# Patient Record
Sex: Female | Born: 1980
Health system: Southern US, Community
[De-identification: ages and names within clinical notes are randomized; demographics above are authoritative.]

## PROBLEM LIST (undated history)

## (undated) DIAGNOSIS — F259 Schizoaffective disorder, unspecified: Secondary | ICD-10-CM

## (undated) DIAGNOSIS — F250 Schizoaffective disorder, bipolar type: Secondary | ICD-10-CM

## (undated) DIAGNOSIS — J45909 Unspecified asthma, uncomplicated: Secondary | ICD-10-CM

---

## 2016-04-24 ENCOUNTER — Emergency Department (HOSPITAL_BASED_OUTPATIENT_CLINIC_OR_DEPARTMENT_OTHER): Payer: Medicaid Other

## 2016-04-24 ENCOUNTER — Encounter (HOSPITAL_BASED_OUTPATIENT_CLINIC_OR_DEPARTMENT_OTHER): Payer: Self-pay | Admitting: Emergency Medicine

## 2016-04-24 ENCOUNTER — Emergency Department (HOSPITAL_BASED_OUTPATIENT_CLINIC_OR_DEPARTMENT_OTHER)
Admission: EM | Admit: 2016-04-24 | Discharge: 2016-04-24 | Disposition: A | Discharge status: Home / Self Care | Payer: Medicaid Other | Attending: Emergency Medicine | Admitting: Emergency Medicine

## 2016-04-24 DIAGNOSIS — J45909 Unspecified asthma, uncomplicated: Secondary | ICD-10-CM | POA: Insufficient documentation

## 2016-04-24 DIAGNOSIS — J069 Acute upper respiratory infection, unspecified: Secondary | ICD-10-CM | POA: Insufficient documentation

## 2016-04-24 DIAGNOSIS — R059 Cough, unspecified: Secondary | ICD-10-CM

## 2016-04-24 DIAGNOSIS — F410 Panic disorder [episodic paroxysmal anxiety] without agoraphobia: Secondary | ICD-10-CM | POA: Diagnosis not present

## 2016-04-24 DIAGNOSIS — Z79899 Other long term (current) drug therapy: Secondary | ICD-10-CM | POA: Insufficient documentation

## 2016-04-24 DIAGNOSIS — R05 Cough: Secondary | ICD-10-CM | POA: Diagnosis present

## 2016-04-24 HISTORY — DX: Schizoaffective disorder, bipolar type: F25.0

## 2016-04-24 HISTORY — DX: Unspecified asthma, uncomplicated: J45.909

## 2016-04-24 HISTORY — DX: Schizoaffective disorder, unspecified: F25.9

## 2016-04-24 MED ORDER — IPRATROPIUM BROMIDE 0.06 % NA SOLN
2.0000 | Freq: Once | NASAL | Status: DC
Start: 2016-04-24 — End: 2016-04-24
  Filled 2016-04-24: qty 15

## 2016-04-24 MED ORDER — IPRATROPIUM BROMIDE 0.06 % NA SOLN: 2.0000 | Freq: Four times a day (QID) | NASAL | 0 refills | Status: DC | PRN

## 2016-04-24 MED ORDER — PREDNISONE 10 MG PO TABS: 60.0000 mg | ORAL_TABLET | Freq: Once | ORAL | Status: DC

## 2016-04-24 MED ORDER — BENZONATATE 100 MG PO CAPS: 100.0000 mg | ORAL_CAPSULE | Freq: Three times a day (TID) | ORAL | 0 refills | Status: AC | PRN

## 2016-04-24 MED ORDER — DEXAMETHASONE 10 MG/ML FOR PEDIATRIC ORAL USE
16.0000 mg | Freq: Once | INTRAMUSCULAR | Status: DC
  Filled 2016-04-24: qty 1.6

## 2016-04-24 MED ORDER — DEXAMETHASONE 6 MG PO TABS
16.0000 mg | ORAL_TABLET | Freq: Once | ORAL | Status: AC
  Administered 2016-04-24: 16 mg via ORAL
  Filled 2016-04-24: qty 1

## 2016-04-24 MED ORDER — BENZONATATE 100 MG PO CAPS: 100.0000 mg | ORAL_CAPSULE | Freq: Three times a day (TID) | ORAL | 0 refills | Status: DC | PRN

## 2016-04-24 MED ORDER — BENZONATATE 100 MG PO CAPS
100.0000 mg | ORAL_CAPSULE | Freq: Once | ORAL | Status: AC
  Administered 2016-04-24: 100 mg via ORAL
  Filled 2016-04-24: qty 1

## 2016-04-24 MED ORDER — LORATADINE 10 MG PO TABS
10.0000 mg | ORAL_TABLET | Freq: Once | ORAL | Status: AC
  Administered 2016-04-24: 10 mg via ORAL
  Filled 2016-04-24: qty 1

## 2016-04-24 MED ORDER — PREDNISONE 50 MG PO TABS
60.0000 mg | ORAL_TABLET | Freq: Once | ORAL | Status: DC
  Filled 2016-04-24: qty 1

## 2016-04-24 MED FILL — BENZONATATE 100 MG CAPSULE: 100 | 6 days supply | Qty: 20 | Fill #0

## 2016-04-24 NOTE — ED Notes (Signed)
PA at bedside.

## 2016-04-24 NOTE — Discharge Instructions (Signed)
May take tessalon as needed up to three times daily for cough. May use OTC antihistamine for sinus congestion. Continue to use your albuterol and advair. Follow up with primary care if symptoms worsen or fail to improve. Return to the ED if you start having fevers, chest pain, sob or for any other reason.

## 2016-04-24 NOTE — ED Provider Notes (Signed)
[facility] Provider Note   CSN: [id number] Arrival date & time: 04/24/16  1149     History   Chief Complaint Chief Complaint  Patient presents with  . URI    HPI Savannah Fleming is a 35 y.o. female.  35 year old Caucasian female past history significant for asthma and schizoaffective schizophrenia presents by EMS to the ED for cough and congestion. Patient states that her symptoms started approximately 3 days ago. They're gradually worsened. She has tried over-the-counter remedies with little relief. Nothing makes better or worse. She endorses sinus congestion, rhinorrhea, otalgia, sore throat, cough. Patient with history of asthma. She was seen at urgent care this past week for worsening shortness of breath. Patient states that her asthma flares with the change in seasons. She has continued to use her albuterol inhaler. She was given an Advair inhaler as well per patient's request at urgent care. Patient states that her breathing improved until she developed URI symptoms. Patient states she was at work today and had a coughing spell and started hyperventilating and experienced numbness and tingling in lips and hands. Spontaneous resolved. 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 States her boss wanted her to come here to get checked out and get work excuse. Patient has no complaints at this time. Denies any OCPs, recent surgeries/hospitalizations, prolonged immobilization, tobacco use, unilateral leg swelling, history of DVT/PE. Patient denies any chest pain.     Past Medical History:  Diagnosis Date  . Asthma   . Schizo affective schizophrenia (HCC)     There are no active problems to display for this patient.   Past Surgical History:  Procedure Laterality Date  . CESAREAN SECTION      OB History    No data available       Home Medications    Prior to Admission medications   Medication Sig Start Date End Date Taking?  Authorizing Provider  ferrous sulfate 325 (65 FE) MG EC tablet Take 325 mg by mouth 3 (three) times daily with meals.   Yes Historical Provider, MD  folic acid (FOLVITE) 1 MG tablet Take 1 mg by mouth daily.   Yes Historical Provider, MD  venlafaxine (EFFEXOR) 75 MG tablet Take 75 mg by mouth 2 (two) times daily.   Yes Historical Provider, MD  ziprasidone (GEODON) 20 MG capsule Take 120 mg by mouth 2 (two) times daily with a meal.   Yes Historical Provider, MD  benzonatate (TESSALON PERLES) 100 MG capsule Take 1 capsule (100 mg total) by mouth 3 (three) times daily as needed for cough. 04/24/16   Rise MuKenneth T Leaphart, PA-C    Family History No family history on file.  Social History Social History  Substance Use Topics  . Smoking status: Never Smoker  . Smokeless tobacco: Never Used  . Alcohol use No     Allergies   Sulfa antibiotics   Review of Systems Review of Systems  Constitutional: Negative for chills, diaphoresis, fatigue and fever.  HENT: Positive for congestion, postnasal drip, rhinorrhea, sinus pressure and sore throat. Negative for ear pain.   Eyes: Negative for pain and visual disturbance.  Respiratory: Positive for cough. Negative for shortness of breath and wheezing.   Cardiovascular: Negative for chest pain, palpitations and leg swelling.  Gastrointestinal: Negative for abdominal pain, diarrhea, nausea and vomiting.  Genitourinary: Negative for dysuria, flank pain, frequency, hematuria and urgency.  Musculoskeletal: Negative for arthralgias and back pain.  Skin: Negative for color change  and rash.  Neurological: Negative for dizziness, syncope, weakness, light-headedness and headaches.  All other systems reviewed and are negative.    Physical Exam Updated Vital Signs BP 122/79 (BP Location: Left Arm)   Pulse 105   Temp 98.2 F (36.8 C) (Oral)   Resp 18   Ht 5\' 2"  (1.575 m)   Wt 88.5 kg   SpO2 95%   BMI 35.67 kg/m   Physical Exam  Constitutional: She  appears well-developed and well-nourished. No distress.  Patient is sitting in room eating lunch from work.  HENT:  Head: Normocephalic and atraumatic.  Right Ear: Tympanic membrane, external ear and ear canal normal.  Left Ear: Tympanic membrane, external ear and ear canal normal.  Nose: Mucosal edema and rhinorrhea present. Right sinus exhibits maxillary sinus tenderness and frontal sinus tenderness. Left sinus exhibits maxillary sinus tenderness and frontal sinus tenderness.  Mouth/Throat: Oropharynx is clear and moist and mucous membranes are normal. No oropharyngeal exudate, posterior oropharyngeal edema or posterior oropharyngeal erythema.  Eyes: Conjunctivae are normal. Right eye exhibits no discharge. Left eye exhibits no discharge. No scleral icterus.  Neck: Normal range of motion. Neck supple. No thyromegaly present.  Cardiovascular: Normal rate, regular rhythm, normal heart sounds and intact distal pulses.   Pulmonary/Chest: Effort normal and breath sounds normal. No respiratory distress. She has no wheezes.  No increased work of breathing, no wheezes noted.  Abdominal: Soft. Bowel sounds are normal. She exhibits no distension. There is no tenderness. There is no rebound and no guarding.  Musculoskeletal: Normal range of motion.  Lymphadenopathy:    She has no cervical adenopathy.  Neurological: She is alert.  Skin: Skin is warm and dry. Capillary refill takes less than 2 seconds.  Vitals reviewed.    ED Treatments / Results  Labs (all labs ordered are listed, but only abnormal results are displayed) Labs Reviewed - No data to display  EKG  EKG Interpretation None       Radiology Dg Chest 2 View  Result Date: 04/24/2016 CLINICAL DATA:  Cough and chest congestion for 4 days.  Asthma. EXAM: CHEST  2 VIEW COMPARISON:  None. FINDINGS: The heart size and mediastinal contours are within normal limits. Both lungs are clear. The visualized skeletal structures are  unremarkable. IMPRESSION: Negative.  No active cardiopulmonary disease. Electronically Signed   By: Myles Rosenthal M.D.   On: 04/24/2016 12:33    Procedures Procedures (including critical care time)  Medications Ordered in ED Medications  benzonatate (TESSALON) capsule 100 mg (100 mg Oral Given 04/24/16 1236)  loratadine (CLARITIN) tablet 10 mg (10 mg Oral Given 04/24/16 1236)  dexamethasone (DECADRON) tablet 16 mg (16 mg Oral Given 04/24/16 1346)     Initial Impression / Assessment and Plan / ED Course  I have reviewed the triage vital signs and the nursing notes.  Pertinent labs & imaging results that were available during my care of the patient were reviewed by me and considered in my medical decision making (see chart for details).  Clinical Course  Patient presented to the ED today with cough and congestion. History of asthma. No wheezing noted on exam. Patient with significant sinus congestion. Chest x-ray unremarkable. Tessalon given for cough. Patient states she does does not need a breathing treatment. Decadron given. Patient states she does not want any steroids to go home with. On reassessment the patient pwas sleeping in room in no acute distress. She states she feels better and is needs a work excuse. Patient with  history of panic attacks. Patient is tachycardic in ED. On discharge heart rate improved still slightly tachycardic. Patient with URI symptoms and history of asthma. Low suspicion for ACS or PE. Patient denies any chest pain or shortness breath at this time. Pt CXR negative for acute infiltrate. Patients symptoms are consistent with URI, likely viral etiology. Discussed that antibiotics are not indicated for viral infections. Pt will be discharged with symptomatic treatment. Patient seen and evaluated by Dr. Criss Alvine who agrees with plan. Patient given prescription for Tessalon for cough. Strict return precautions given. Patient verbalized understanding the plan of care.  Encouraged to follow up with PCP. Hemodynamically stable. Discharged home in no acute distress with stable vital signs.     Final Clinical Impressions(s) / ED Diagnoses   Final diagnoses:  URI (upper respiratory infection)  Panic attack  Cough  Asthma, unspecified asthma severity, uncomplicated    New Prescriptions Discharge Medication List as of 04/24/2016  1:58 PM    START taking these medications   Details  benzonatate (TESSALON PERLES) 100 MG capsule Take 1 capsule (100 mg total) by mouth 3 (three) times daily as needed for cough., Starting Wed 04/24/2016, Print         Rise Mu, PA-C 04/24/16 1553    Pricilla Loveless, MD 04/27/16 [phone]

## 2016-04-24 NOTE — ED Triage Notes (Addendum)
[facility], pt reports cough and congestion since Sunday. At work today she started hyperventilating and experience numbness and tingling to hands. Pt eating lunch during triage.

## 2016-04-24 NOTE — ED Notes (Signed)
Patient transported to X-ray 

## 2017-10-03 IMAGING — CR DG CHEST 2V
2 series · 2 of 2 positions shown · non-contrast
Comparison: None.

CLINICAL DATA: Cough and chest congestion for 4 days.  Asthma.

EXAM:
CHEST  2 VIEW

[w chest pa]
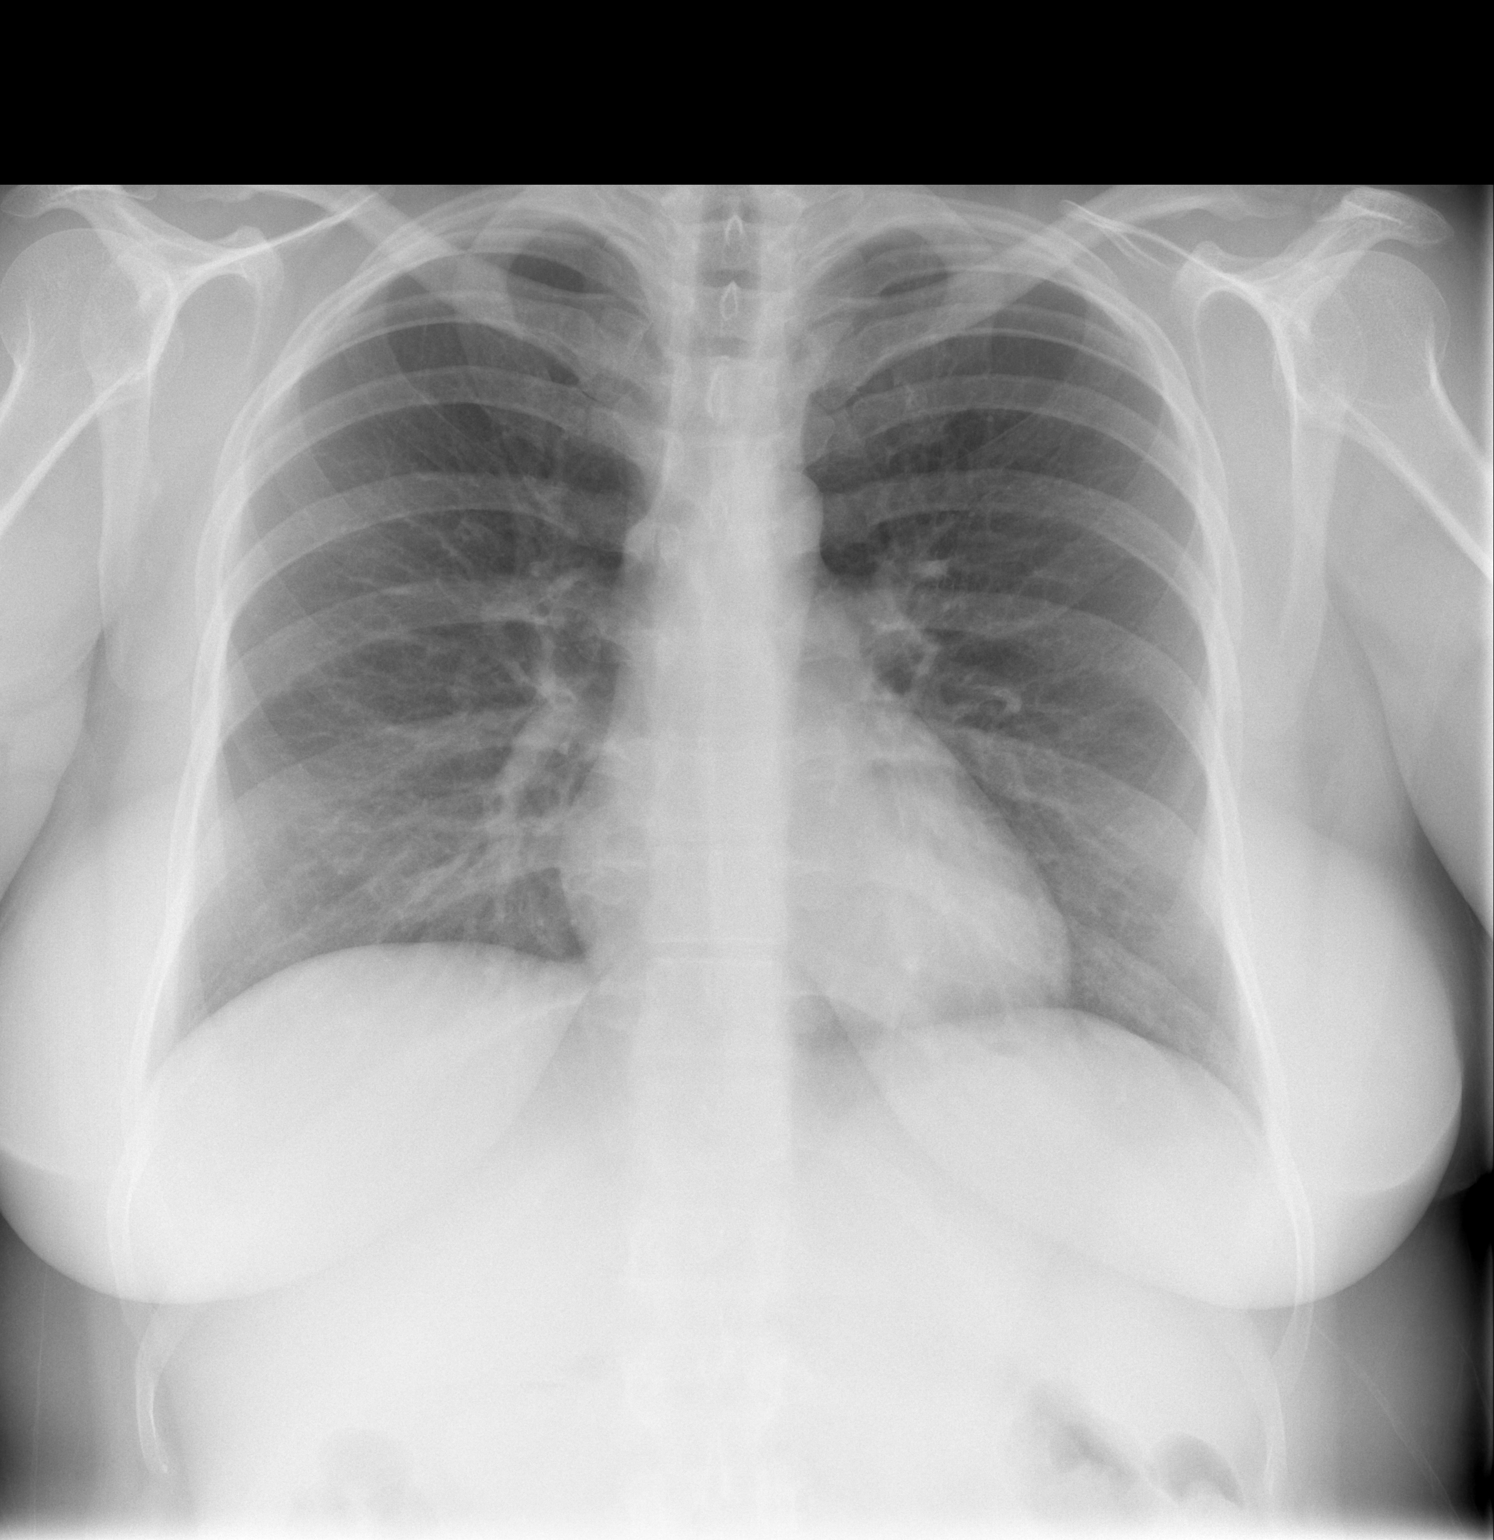

[w chest lat]
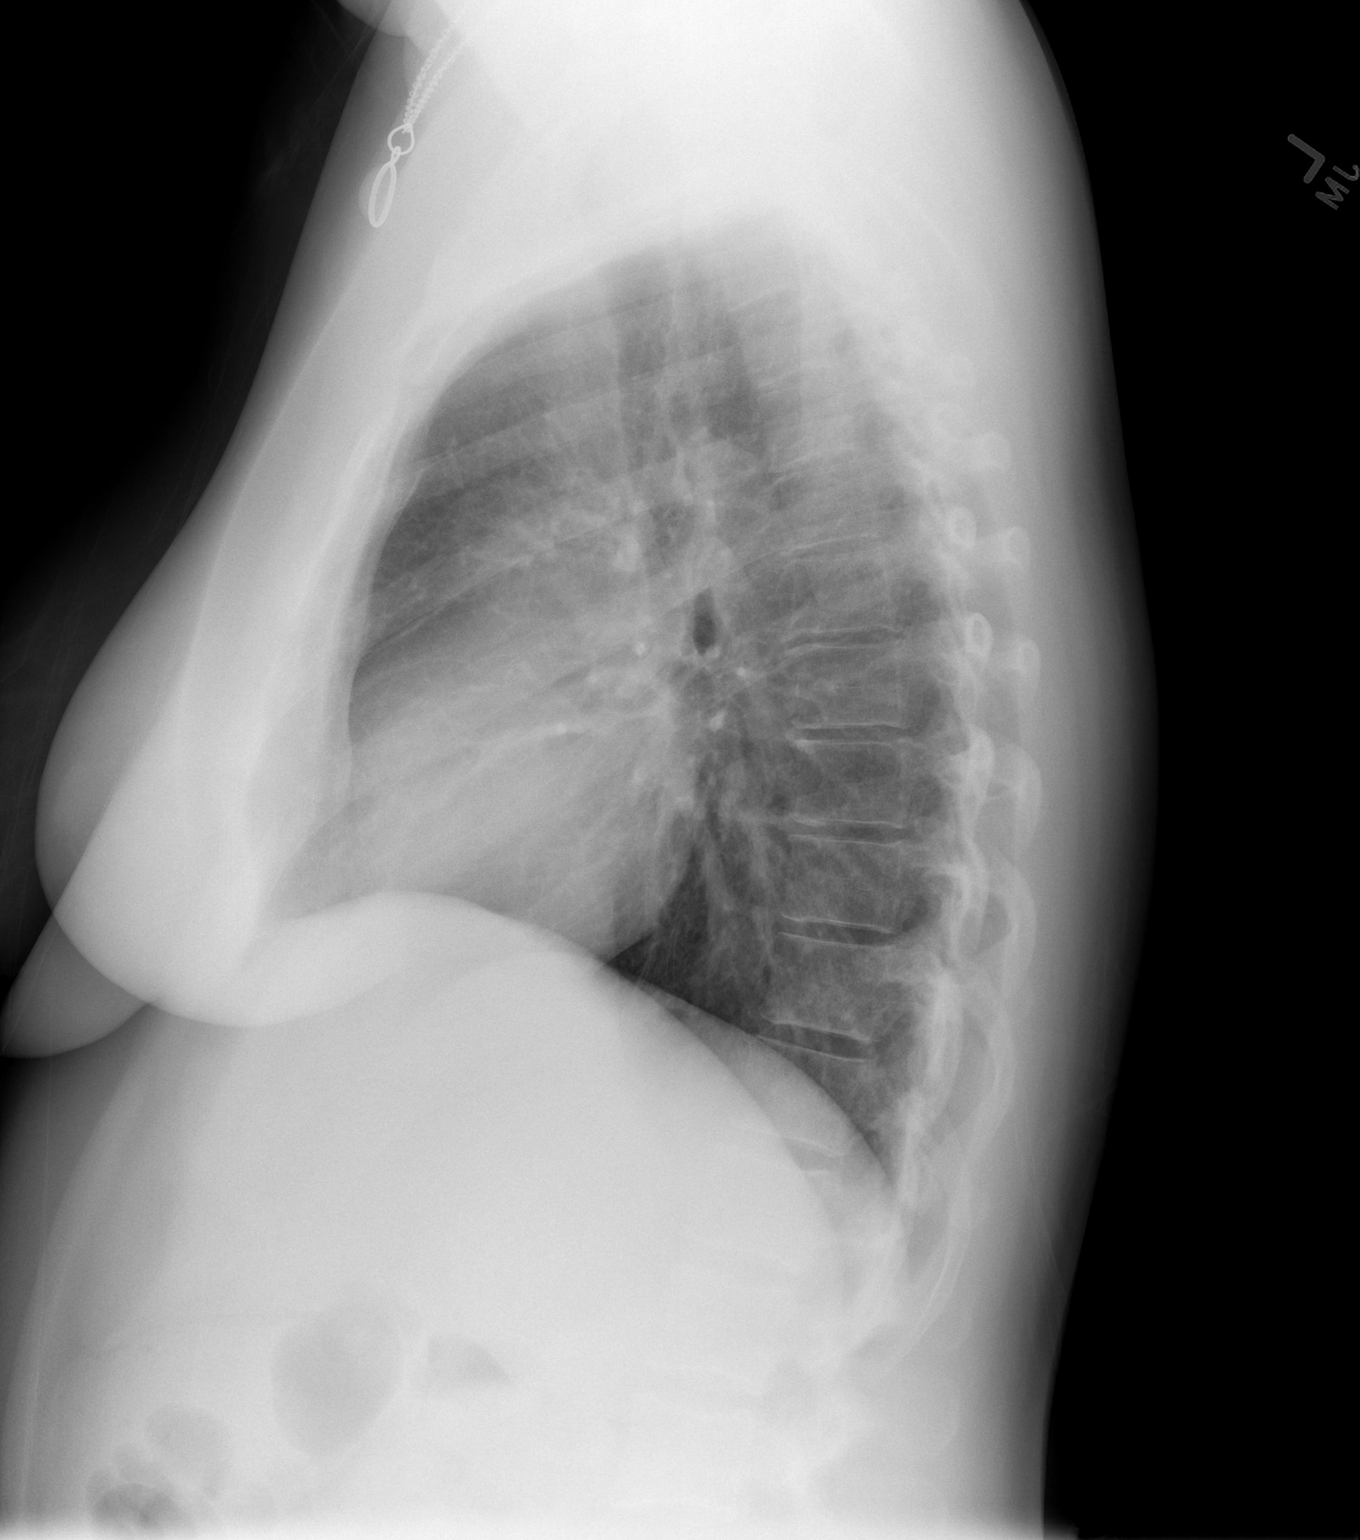

[2 of 2 positions shown; findings below may reference images not displayed]

FINDINGS: The heart size and mediastinal contours are within normal limits.
Both lungs are clear. The visualized skeletal structures are
unremarkable.
IMPRESSION: Negative.  No active cardiopulmonary disease.

## 2022-08-27 ENCOUNTER — Encounter: Payer: Self-pay | Admitting: *Deleted

## 2022-08-28 NOTE — Congregational Nurse Program (Signed)
  Dept: [phone]   Congregational Nurse Program Note  Date of Encounter: 08/27/2022  Past Medical History: Past Medical History:  Diagnosis Date   Asthma    Schizo affective schizophrenia [facility])     Encounter Details:  CNP Questionnaire - 08/27/22 1830       Questionnaire   Ask client: Do you give verbal consent for me to treat you today? Yes    Student Assistance N/A    Location Patient [place]    Visit Setting with Client [place]    Patient Status Unknown    Insurance Unknown    Insurance/Financial Assistance Referral N/A    Medication N/A    Medical Provider Yes    Screening Referrals Made N/A    Medical Referrals Made N/A    Medical Appointment Made N/A    Recently w/o PCP, now 1st time PCP visit completed due to CNs referral or appointment made N/A    Food Have Food Insecurities    Transportation Need transportation assistance    Housing/Utilities N/A    Interpersonal Safety N/A    Interventions Advocate/Support;Counsel;Reviewed Medications    Abnormal to Normal Screening Since Last CN Visit N/A    Screenings CN Performed N/A    Sent Client to Lab for: N/A    Did client attend any of the following based off CNs referral or appointments made? N/A    ED Visit Averted Yes    Life-Saving Intervention Made N/A            Client came into [employer] [facility] nurse clinic this evening.  Client is concerned that she is having GI issues related to Ozempic medication.  We discussed her diabetes and A1C results.  She is on Jardiance but cannot take Metformin.  She is not losing weight with the Ozempic, but her A1c is better.  She cannot tolerate the GI issues.  After discussion, client is going to see her doctor and relate all symptoms and concerns to MD.  Client will follow up with this CN as needed.  Karene Fry, RN, MSN, [place] Office [phone] Cell

## 2022-10-28 ENCOUNTER — Encounter: Payer: Self-pay | Admitting: *Deleted

## 2022-10-29 ENCOUNTER — Encounter: Payer: Self-pay | Admitting: *Deleted

## 2022-10-29 NOTE — Congregational Nurse Program (Signed)
  Dept: [phone]   Congregational Nurse Program Note  Date of Encounter: 10/28/2022  Past Medical History: Past Medical History:  Diagnosis Date   Asthma    Schizo affective schizophrenia [facility])     Encounter Details:  CNP Questionnaire - 10/28/22 1500       Questionnaire   Ask client: Do you give verbal consent for me to treat you today? Yes    Student Assistance N/A    Location Patient [place]    Visit Setting with Client Phone/Text/Email    Patient Status Unknown    Insurance Unknown    Insurance/Financial Assistance Referral N/A    Medication N/A    Medical Provider Yes    Screening Referrals Made N/A    Medical Referrals Made N/A    Medical Appointment Made N/A    Recently w/o PCP, now 1st time PCP visit completed due to CNs referral or appointment made N/A    Food Have Food Insecurities    Transportation Need transportation assistance    Housing/Utilities N/A    Interpersonal Safety N/A    Interventions Advocate/Support;Counsel    Abnormal to Normal Screening Since Last CN Visit N/A    Screenings CN Performed N/A    Sent Client to Lab for: N/A    Did client attend any of the following based off CNs referral or appointments made? N/A    ED Visit Averted Yes    Life-Saving Intervention Made N/A            Client contacted this CN to let me know that she had a panic attack and that she was in the hospital.  Her friend, Christean Grief, was with her.  Discussed reasons for panic attack.  Suggested counseling for client, but she is seeing a counselor weekly now.  Supported client and will follow up tomorrow when client comes in [employer].  Karene Fry, RN, MSN, [place] Office [phone] Cell

## 2022-11-28 NOTE — Congregational Nurse Program (Signed)
  Dept: [phone]   Congregational Nurse Program Note  Date of Encounter: 10/29/2022  Past Medical History: Past Medical History:  Diagnosis Date   Asthma    Schizo affective schizophrenia [facility])     Encounter Details:  CNP Questionnaire - 11/28/22 1823       Questionnaire   Ask client: Do you give verbal consent for me to treat you today? Yes    Student Assistance N/A    Location Patient Served  [facility]    Visit Setting with Client Phone/Text/Email    Patient Status Unknown    Insurance Unknown    Insurance/Financial Assistance Referral N/A    Medication N/A    Medical Provider Yes    Screening Referrals Made N/A    Medical Referrals Made N/A    Medical Appointment Made N/A    Recently w/o PCP, now 1st time PCP visit completed due to CNs referral or appointment made N/A    Food Have Food Insecurities    Transportation Need transportation assistance    Housing/Utilities N/A    Interpersonal Safety N/A    Interventions Advocate/Support;Counsel;Educate    Abnormal to Normal Screening Since Last CN Visit N/A    Screenings CN Performed N/A    Sent Client to Lab for: N/A    Did client attend any of the following based off CNs referral or appointments made? N/A    ED Visit Averted Yes    Life-Saving Intervention Made N/A

## 2023-05-19 ENCOUNTER — Ambulatory Visit: Payer: 59 | Admitting: Behavioral Health

## 2023-06-23 ENCOUNTER — Ambulatory Visit: Payer: 59 | Admitting: Behavioral Health

## 2023-06-23 ENCOUNTER — Encounter: Payer: Self-pay | Admitting: Behavioral Health

## 2023-06-23 DIAGNOSIS — F259 Schizoaffective disorder, unspecified: Secondary | ICD-10-CM

## 2023-06-23 DIAGNOSIS — F4310 Post-traumatic stress disorder, unspecified: Secondary | ICD-10-CM
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
## 2023-06-23 NOTE — Progress Notes (Signed)
                Savannah Fleming, LCMHC 

## 2023-07-22 ENCOUNTER — Telehealth: Payer: Self-pay | Admitting: Behavioral Health

## 2023-07-22 NOTE — Telephone Encounter (Signed)
I returned the patient's phone call.  Over the past 6 months she and her daughter and friend Dorma Russell had been in the intensive home care therapy.  This was through [facility] as well as [facility].  They did not feel that the patient's child was making progress so they recommended taking her child, Annia Friendly back to therapeutic foster care where she was prior to coming home for intensive in home.  The patient said they said it would be mandatory and the patient said she became very angry and said some things which should not of NSAID and DSS was called.  The patient acknowledges she was upset and knows that her mental health issue at times causes her to react very strongly emotionally and she feels bad about that.  She did meet again with multiple levels of [facility] and was well prepared.  She said today they said that therapeutic foster care was not mandatory but she is not sure what the next up is in terms of intensive in-home therapy.  She just wanted to be able to vent her frustrations about the entire situation and began to talk about what steps that she should take next.  I am meeting with her again on December 19 in office.  She did contract for safety saying she would never hurt herself or her daughter.

## 2023-07-31 ENCOUNTER — Ambulatory Visit: Payer: 59 | Admitting: Behavioral Health

## 2023-07-31 ENCOUNTER — Encounter: Payer: Self-pay | Admitting: Behavioral Health

## 2023-07-31 DIAGNOSIS — F259 Schizoaffective disorder, unspecified: Secondary | ICD-10-CM

## 2023-07-31 DIAGNOSIS — F4310 Post-traumatic stress disorder, unspecified: Secondary | ICD-10-CM
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
## 2023-07-31 NOTE — Progress Notes (Signed)
I             French Ana, [facility]

## 2023-08-18 ENCOUNTER — Encounter: Payer: Self-pay | Admitting: Behavioral Health

## 2023-08-18 ENCOUNTER — Ambulatory Visit: Payer: 59 | Admitting: Behavioral Health

## 2023-08-18 DIAGNOSIS — F4310 Post-traumatic stress disorder, unspecified: Secondary | ICD-10-CM

## 2023-08-18 DIAGNOSIS — F259 Schizoaffective disorder, unspecified: Secondary | ICD-10-CM

## 2023-08-18 NOTE — Progress Notes (Signed)
 [facility] Counselor/Therapist Progress Note  Patient ID: Savannah Fleming, MRN: [id number],    Date: 08/18/2023  Time Spent: 55 minutes, 8 AM until 8:55 AM This session was held via video teletherapy. The patient consented to the video teletherapy and was located in her office during this session. She is aware it is the responsibility of the patient to secure confidentiality on her end of the session. The provider was in a private home office for the duration of this session.      Treatment Type: Individual Therapy  Reported Symptoms: Anxiety, depression  Mental Status Exam: Appearance:  Casual     Behavior: Appropriate  Motor: Normal  Speech/Language:  Normal Rate  Affect: Flat  Mood: angry, anxious, and sad  Thought process: normal  Thought content:   WNL  Sensory/Perceptual disturbances:   WNL  Orientation: oriented to person, place, time/date, situation, day of week, month of year, and year  Attention: Good  Concentration: Good  Memory: WNL  Fund of knowledge:  Good  Insight:   Good  Judgment:  Good  Impulse Control: Good   Risk Assessment: Danger to Self:  No Self-injurious Behavior: No Danger to Others: No Duty to Warn:no Physical Aggression / Violence:No  Access to Firearms a concern: No  Gang Involvement:No   Subjective: The patient had been to the gym this morning and is doing fairly consistently.  She has tried several different weight loss medications which make her very sick so she is staying off of that now but will be working with a bariatric specialist starting in the next few weeks.  The focus will be weight loss and exercise.  They are requesting she limit her calorie intake to 1500 that she feels that may not be healthy so I encouraged continued conversation with her providers with that.  I did offer her a continued exercise especially swimming which is what she does well.  Approximately a month ago a CPS worker told her that if she did not  agree to therapeutic foster care for her daughter that she would get a petition to make that happen.  She said there has been no contact whatsoever with CPS since that point in time.  They had made no effort to check in on hers or her daughter's welfare.  She said that her daughter said some things that were untrue about her and that the CPS worker said things that were also not true in her report Savannah Fleming dating back in part for a couple of years based on some things that happened.  She is meeting with an attorney on January 14 to explore options feeling like this was handled poorly.  The patient said that she made a nice a beautiful dinner for her friend and daughter.  It was appreciated by her friend but that her daughter really did not want what she had made.  That led to his looking at how the patient sees her love languages of ask of service and what is behind that specifically.  She said that her tendency is to be holding herself and if she does not make everything from scratch she feels that she has failed or let other people down and is very hard on herself.  She said that her biological mother makes 3 meals a day from scratch and in some ways she has convinced herself that she has to live up to that standard but knows that she cannot and knows that is not realistic.  We talked about  giving herself grace but we also emphasized the importance of challenging those irrational thoughts and I encouraged her to write those down and look at ways to challenge them.  As her faith is important to her we talked about how she sees herself versus how God sees her and the importance of Ronnald and her faith relationship also.   The patient does have good supports in her friend Aliene as well as other friends and sponsors from [employer] and [place].  She is also in a group with the Association for children of alcoholics and they are working through a workbook so she has supports of them also.  She does contract for  safety.  Interventions: Cognitive Behavioral Therapy Diagnosis: Schizoaffective disorder Plan: I will meet with the patient every 2 weeks in person as possible   treatment plan:: Goals will be to reduce anxiety and depression by at least 50% to the use of cognitive behavioral therapy, dialectical behavior therapy with a target goal of February 09, 2024.  Goals for reducing stress and anxiety will be to improve her ability to manage anxiety symptoms and stress in a healthier way, identify and process causes for anxiety and explore ways to reduce it, resolve the core conflicts that are contributing to anxiety as well as help her manage thoughts and worrisome thinking contributing to feelings of anxiety.  Interventions include providing education about anxiety and stress, facilitate problem solution skills as well as teach coping skills to help manage anxiety and stress.  We will also use cognitive behavioral therapy to identify and change anxiety provoking thought and behavior patterns as well as dialectical behavior therapy to teach distress tolerance and mindfulness skills.  Goals for reducing depression will be to help the patient have less sadness as indicated by patient report and PHQ-9 scores.  We will have goals of improved mood or returning to a healthier level of functioning as well as identify causes for depressed mood as a way to help cope with depression.  We will use a crisis plan if self-harm or suicidal thoughts occur.  Interventions include using cognitive behavioral therapy to explore and replace unhealthy thoughts contributing to depression.  We will encourage the sharing of feelings related to the causes and symptoms of depression as well as teach and encouraged the use of coping skills for managing depressive symptoms.  We will make a crisis plan and assess ongoing level of safety as needed.  Progress 20%  Lorrene CHRISTELLA Hasten, [facility]                  Lorrene CHRISTELLA Hasten, [facility]

## 2023-09-04 ENCOUNTER — Ambulatory Visit: Payer: 59 | Admitting: Behavioral Health

## 2023-09-04 ENCOUNTER — Encounter: Payer: Self-pay | Admitting: Behavioral Health

## 2023-09-04 DIAGNOSIS — F259 Schizoaffective disorder, unspecified: Secondary | ICD-10-CM | POA: Diagnosis not present

## 2023-09-04 DIAGNOSIS — F4310 Post-traumatic stress disorder, unspecified: Secondary | ICD-10-CM

## 2023-09-04 NOTE — Progress Notes (Signed)
[facility] Counselor/Therapist Progress Note  Patient ID: Denija Vantassel, MRN: [id number],    Date: 09/04/2023  Time Spent: 52 minutes, 10:01 AM until 1053 AM This session was held via video teletherapy. The patient consented to the video teletherapy and was located in her office during this session. She is aware it is the responsibility of the patient to secure confidentiality on her end of the session. The provider was in a private home office for the duration of this session.      Treatment Type: Individual Therapy  Reported Symptoms: Anxiety, depression  Mental Status Exam: Appearance:  Casual     Behavior: Appropriate  Motor: Normal  Speech/Language:  Normal Rate  Affect: Flat  Mood: angry, anxious, and sad  Thought process: normal  Thought content:   WNL  Sensory/Perceptual disturbances:   WNL  Orientation: oriented to person, place, time/date, situation, day of week, month of year, and year  Attention: Good  Concentration: Good  Memory: WNL  Fund of knowledge:  Good  Insight:   Good  Judgment:  Good  Impulse Control: Good   Risk Assessment: Danger to Self:  No Self-injurious Behavior: No Danger to Others: No Duty to Warn:no Physical Aggression / Violence:No  Access to Firearms a concern: No  Gang Involvement:No   Subjective: There have been some positives since our last session.  She did hire a Clinical research associate based on interactions with [facility] and child protective services.  She has ceased any interactions with [facility].  She has a new caseworker starting with child protective services and they are looking for testing for her daughter as well as a new agency to provide intensive in-home therapy which is what she was asking for.  They had taken therapeutic foster care off of the table.  She said that she had developed an itch especially in her hands and feet and when she got most of that news the itching stopped almost instantly and  recognize that it was more about her stress and anxiety level.  She is restricting how much she works so that she can be more available to take her daughter to appointments that she needs including testing and can be home for the intensive in-home therapy.  What we focused on today was the patient being more mindful or aware of how she interacts verbally and nonverbally with her daughter.  We used as an example the fact that her daughter at times listens to her music too loudly.  They have a neighbor with a new baby and the patient wants her to return respect especially for neighbors but respect in general and her daughter does not always respect her when she asked her to turn the music down.  We talked about finding the balance between how she initiates conversations about that with the importance of learning respect obedience but also in how the patient presents and in terms of tone of voice volume body language facial expression etc.  We came up with a couple of things that the patient said at work and I encouraged her to be mindful of and practice those before any year interactions with her daughter so that she will be more aware of when those interactions take place.  She recognizes that she is listening to that child voice within her in the way that her parents deal with her in a very hoarse and authoritative way so that we can begin to challenge that even more.   The patient does  have good supports in her friend Dorma Russell as well as other friends and sponsors from [employer] and [place].  She is also in a group with the Association for children of alcoholics and they are working through a workbook so she has supports of them also.  She does contract for safety.  Interventions: Cognitive Behavioral Therapy Diagnosis: Schizoaffective disorder Plan: I will meet with the patient every 2 weeks in person as possible   treatment plan:: Goals will be to reduce anxiety and depression by at least 50% to the use of cognitive  behavioral therapy, dialectical behavior therapy with a target goal of February 09, 2024.  Goals for reducing stress and anxiety will be to improve her ability to manage anxiety symptoms and stress in a healthier way, identify and process causes for anxiety and explore ways to reduce it, resolve the core conflicts that are contributing to anxiety as well as help her manage thoughts and worrisome thinking contributing to feelings of anxiety.  Interventions include providing education about anxiety and stress, facilitate problem solution skills as well as teach coping skills to help manage anxiety and stress.  We will also use cognitive behavioral therapy to identify and change anxiety provoking thought and behavior patterns as well as dialectical behavior therapy to teach distress tolerance and mindfulness skills.  Goals for reducing depression will be to help the patient have less sadness as indicated by patient report and PHQ-9 scores.  We will have goals of improved mood or returning to a healthier level of functioning as well as identify causes for depressed mood as a way to help cope with depression.  We will use a crisis plan if self-harm or suicidal thoughts occur.  Interventions include using cognitive behavioral therapy to explore and replace unhealthy thoughts contributing to depression.  We will encourage the sharing of feelings related to the causes and symptoms of depression as well as teach and encouraged the use of coping skills for managing depressive symptoms.  We will make a crisis plan and assess ongoing level of safety as needed.  Progress 20%  French Ana, [facility]                  French Ana, [facility]               French Ana, [facility]

## 2023-09-16 ENCOUNTER — Ambulatory Visit: Payer: 59 | Admitting: Behavioral Health

## 2023-09-16 ENCOUNTER — Encounter: Payer: Self-pay | Admitting: Behavioral Health

## 2023-09-16 DIAGNOSIS — F4310 Post-traumatic stress disorder, unspecified: Secondary | ICD-10-CM

## 2023-09-16 DIAGNOSIS — F259 Schizoaffective disorder, unspecified: Secondary | ICD-10-CM
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
## 2023-09-29 ENCOUNTER — Ambulatory Visit: Payer: 59 | Admitting: Behavioral Health

## 2023-09-29 ENCOUNTER — Encounter: Payer: Self-pay | Admitting: Behavioral Health

## 2023-09-29 DIAGNOSIS — F4310 Post-traumatic stress disorder, unspecified: Secondary | ICD-10-CM | POA: Diagnosis not present

## 2023-09-29 NOTE — Progress Notes (Signed)
 [facility] Counselor/Therapist Progress Note  Patient ID: Savannah Fleming, MRN: [id number],    Date: 09/29/2023  Time Spent, 11 AM until 11:55 AM, 55 minutes spent in person with the patient in the outpatient therapist office.  Treatment Type: Individual Therapy  Reported Symptoms: Anxiety, depression  Mental Status Exam: Appearance:  Casual     Behavior: Appropriate  Motor: Normal  Speech/Language:  Normal Rate  Affect: Flat  Mood: angry, anxious, and sad  Thought process: normal  Thought content:   WNL  Sensory/Perceptual disturbances:   WNL  Orientation: oriented to person, place, time/date, situation, day of week, month of year, and year  Attention: Good  Concentration: Good  Memory: WNL  Fund of knowledge:  Good  Insight:   Good  Judgment:  Good  Impulse Control: Good   Risk Assessment: Danger to Self:  No Self-injurious Behavior: No Danger to Others: No Duty to Warn:no Physical Aggression / Violence:No  Access to Firearms a concern: No  Gang Involvement:No   Subjective: Through CPS they have arranged intensive in-home therapy.  The therapist through an organization called [employer] is meeting with the patient and her daughter 2 hours a day 5 days a week and then checking and on weekends.  The biggest thing that they point out is the importance of the patient keeping her house picked up including dishes washed for swept etc.  She says her house is always clean but depending on where she is in the cycle reflects how the house looks.  She is trying to do what they ask.  They have not seen anything else about the patient's interaction with her daughter that she is 6 pressing concern about.  The patient has proof that her daughter just her is telling her boyfriend that her mother is strung out on drugs and not making good decisions and is verbally abusive.  The patient has had no alcohol in 16-1/2 years and no drugs in over 22 years.  She is even offering to  take a drug test but CPS has not asked for that.  CPS is encouraging the patient to set firmer boundaries knowing that the patient is providing good food choices a healthy place to live, safe place to live and encouraging her to get her schoolwork done.  One of the conditions for her daughter not running away was that she could spend the weekend with her maternal grandparents.  The patient was hesitant about that and still has some hesitation.  There is minimal conversation but the patient says that it is not a close relationship and not sure that she wants her daughter to be exposed to that on a regular basis.  I stressed the patient's own self-care and reminded her that she is doing all that she is supposed to do as a mother and all the CPS is asking her to do and to continue to set firm boundaries with her daughter.  She knows that she is not responsible for her daughter's choices but knows that her daughter is not making good choices.  The patient does have good supports in her friend Dorma Russell as well as other friends and sponsors from [employer] and [place].  She is also in a group with the Association for children of alcoholics and they are working through a workbook so she has supports of them also.  She does contract for safety.  Interventions: Cognitive Behavioral Therapy Diagnosis: Schizoaffective disorder Plan: I will meet with the patient every 2 weeks in  person as possible   treatment plan:: Goals will be to reduce anxiety and depression by at least 50% to the use of cognitive behavioral therapy, dialectical behavior therapy with a target goal of February 09, 2024.  Goals for reducing stress and anxiety will be to improve her ability to manage anxiety symptoms and stress in a healthier way, identify and process causes for anxiety and explore ways to reduce it, resolve the core conflicts that are contributing to anxiety as well as help her manage thoughts and worrisome thinking contributing to feelings of anxiety.   Interventions include providing education about anxiety and stress, facilitate problem solution skills as well as teach coping skills to help manage anxiety and stress.  We will also use cognitive behavioral therapy to identify and change anxiety provoking thought and behavior patterns as well as dialectical behavior therapy to teach distress tolerance and mindfulness skills.  Goals for reducing depression will be to help the patient have less sadness as indicated by patient report and PHQ-9 scores.  We will have goals of improved mood or returning to a healthier level of functioning as well as identify causes for depressed mood as a way to help cope with depression.  We will use a crisis plan if self-harm or suicidal thoughts occur.  Interventions include using cognitive behavioral therapy to explore and replace unhealthy thoughts contributing to depression.  We will encourage the sharing of feelings related to the causes and symptoms of depression as well as teach and encouraged the use of coping skills for managing depressive symptoms.  We will make a crisis plan and assess ongoing level of safety as needed.  Progress 20%  French Ana, [facility]                  French Ana, [facility]               French Ana, [facility]               French Ana, [facility]               French Ana, [facility]

## 2023-10-13 ENCOUNTER — Encounter: Payer: Self-pay | Admitting: Behavioral Health

## 2023-10-13 ENCOUNTER — Ambulatory Visit: Payer: 59 | Admitting: Behavioral Health

## 2023-10-13 DIAGNOSIS — F259 Schizoaffective disorder, unspecified: Secondary | ICD-10-CM | POA: Diagnosis not present

## 2023-10-13 DIAGNOSIS — F4310 Post-traumatic stress disorder, unspecified: Secondary | ICD-10-CM

## 2023-10-13 NOTE — Progress Notes (Signed)
 [facility] Counselor/Therapist Progress Note  Patient ID: Savannah Fleming, MRN: [id number],    Date: 10/13/2023  Time Spent: 8 AM until 8:56 AM, 56 minutes spent in person with the patient in the outpatient therapist office.  Treatment Type: Individual Therapy  Reported Symptoms: Anxiety, depression  Mental Status Exam: Appearance:  Casual     Behavior: Appropriate  Motor: Normal  Speech/Language:  Normal Rate  Affect: Flat  Mood: angry, anxious, and sad  Thought process: normal  Thought content:   WNL  Sensory/Perceptual disturbances:   WNL  Orientation: oriented to person, place, time/date, situation, day of week, month of year, and year  Attention: Good  Concentration: Good  Memory: WNL  Fund of knowledge:  Good  Insight:   Good  Judgment:  Good  Impulse Control: Good   Risk Assessment: Danger to Self:  No Self-injurious Behavior: No Danger to Others: No Duty to Warn:no Physical Aggression / Violence:No  Access to Firearms a concern: No  Gang Involvement:No   Subjective: Last few weeks have been difficult for the patient.  Her daughter was hospitalized again for a week and came home on Thursday of last week.  The week that her daughter was gone with the week and her mental health cycle where she was the most down so she had a hard time getting out of bed but said she still did more than she normally does in that week and felt some sense of accomplishment in that.  Over the weekend since her child was home she did an extraordinary amount of work to get the home back in order.  That is one of the things that her daughter is the animal therapist has been focusing on for the patient feels like too much.  She knows she does not keep her home up perfectly but feels that she keeps it neat enough and that is not what contributing to her daughter's mental health issues.  Intensive in home is meeting with she and her child 3 times a week for 2 hours and [employer] is  also coming in 2 days a week.  The homebuild[employer] program will stop coming next week after 4 weeks.  Intensive in-home will continue.  Her daughter's therapist pointed out that others have said good things about her including CPS, her friend that when her friend CC her friend Stefano Gaul and yet the patient has a hard time hearing that.  She knows it comes from and is deep rooted in lies that her parents told her growing up.  She said the best complement she ever got from her dad was "it ain't half bad" and even that was rare and hard to come by.  She said they were never positive with her and in fact very critical with her on a regular basis so she is hard on herself.  She knows that it is a defense mechanism and she would rather be hard on herself or say something better about herself before somebody else can and even if they do it does not hurt as much because she has already done it to herself.  We started looking deeper into the roots of those lies that she is telling herself and have started challenging those.  She is very mindful so I encouraged her for homework to initially just be mindful of those thoughts that are self critical as they come to her mind and begin to challenge them in a mild way.  We will continue to work on that.  The patient does have good supports in her friend Dorma Russell as well as other friends and sponsors from [employer] and [place].  She is also in a group with the Association for children of alcoholics and they are working through a workbook so she has supports of them also.  She does contract for safety.  Interventions: Cognitive Behavioral Therapy Diagnosis: Schizoaffective disorder Plan: I will meet with the patient every 2 weeks in person as possible   treatment plan:: Goals will be to reduce anxiety and depression by at least 50% to the use of cognitive behavioral therapy, dialectical behavior therapy with a target goal of February 09, 2024.  Goals for reducing stress and anxiety will be to  improve her ability to manage anxiety symptoms and stress in a healthier way, identify and process causes for anxiety and explore ways to reduce it, resolve the core conflicts that are contributing to anxiety as well as help her manage thoughts and worrisome thinking contributing to feelings of anxiety.  Interventions include providing education about anxiety and stress, facilitate problem solution skills as well as teach coping skills to help manage anxiety and stress.  We will also use cognitive behavioral therapy to identify and change anxiety provoking thought and behavior patterns as well as dialectical behavior therapy to teach distress tolerance and mindfulness skills.  Goals for reducing depression will be to help the patient have less sadness as indicated by patient report and PHQ-9 scores.  We will have goals of improved mood or returning to a healthier level of functioning as well as identify causes for depressed mood as a way to help cope with depression.  We will use a crisis plan if self-harm or suicidal thoughts occur.  Interventions include using cognitive behavioral therapy to explore and replace unhealthy thoughts contributing to depression.  We will encourage the sharing of feelings related to the causes and symptoms of depression as well as teach and encouraged the use of coping skills for managing depressive symptoms.  We will make a crisis plan and assess ongoing level of safety as needed.  Progress 20%  French Ana, [facility]                  French Ana, [facility]               French Ana, [facility]               French Ana, [facility]               French Ana, [facility]               French Ana, [facility]

## 2023-10-27 ENCOUNTER — Encounter: Payer: Self-pay | Admitting: Behavioral Health

## 2023-10-27 ENCOUNTER — Ambulatory Visit (INDEPENDENT_AMBULATORY_CARE_PROVIDER_SITE_OTHER): Payer: 59 | Admitting: Behavioral Health

## 2023-10-27 DIAGNOSIS — F4310 Post-traumatic stress disorder, unspecified: Secondary | ICD-10-CM | POA: Diagnosis not present

## 2023-10-27 NOTE — Progress Notes (Signed)
 [name] Counselor/Therapist Progress Note  Patient ID: Savannah Fleming, MRN: [id number],    Date: 10/27/2023  Time Spent:   3 PM until 3:50 PM, 50 minutes spent in person with the patient in the outpatient therapist office.  Treatment Type: Individual Therapy  Reported Symptoms: Anxiety, depression  Mental Status Exam: Appearance:  Casual     Behavior: Appropriate  Motor: Normal  Speech/Language:  Normal Rate  Affect: Flat  Mood: angry, anxious, and sad  Thought process: normal  Thought content:   WNL  Sensory/Perceptual disturbances:   WNL  Orientation: oriented to person, place, time/date, situation, day of week, month of year, and year  Attention: Good  Concentration: Good  Memory: WNL  Fund of knowledge:  Good  Insight:   Good  Judgment:  Good  Impulse Control: Good   Risk Assessment: Danger to Self:  No Self-injurious Behavior: No Danger to Others: No Duty to Warn:no Physical Aggression / Violence:No  Access to Firearms a concern: No  Gang Involvement:No   Subjective: The patient has completed her rough draft at her friend review it.  It took her a year to do the rough draft so she is starting to work the second draft knowing there are things that she can clarify make better.  I praised her for the work that she has put in and encouraged her to continue to pursue that passion.  There was a very difficult conversation with her daughter recently.  She was thankful that her daughter opened up to her about this in particular but said it triggered some things for her.  We begin to process how that triggered her and why that triggered her and how she responded to her daughter.  She wanted to make sure that her daughter knew that she was believed when too many times the patient was not believed by those who was supposed of care about her.  She continues to struggle with seeing herself as good and finding difficulty with looking for positive things about herself.   Her education is important to her and she has equated the fact that she has to have her PhD in order to feel like she has some value.  We talked about seeing all the positives about herself not denying the fact that she wants to and should work for her PhD but was starting to see her self in a more positive good way.  We will process more of her past and the next session.  She has requested that we start to go to weekly sessions while we try to accommodate as soon as possible.  The patient does have good supports in her friend Dorma Russell as well as other friends and sponsors from [employer] and [place].  She is also in a group with the Association for children of alcoholics and they are working through a workbook so she has supports of them also.  She does contract for safety.  Interventions: Cognitive Behavioral Therapy Diagnosis: Schizoaffective disorder Plan: I will meet with the patient every 2 weeks in person as possible   treatment plan:: Goals will be to reduce anxiety and depression by at least 50% to the use of cognitive behavioral therapy, dialectical behavior therapy with a target goal of February 09, 2024.  Goals for reducing stress and anxiety will be to improve her ability to manage anxiety symptoms and stress in a healthier way, identify and process causes for anxiety and explore ways to reduce it, resolve the core conflicts that are  contributing to anxiety as well as help her manage thoughts and worrisome thinking contributing to feelings of anxiety.  Interventions include providing education about anxiety and stress, facilitate problem solution skills as well as teach coping skills to help manage anxiety and stress.  We will also use cognitive behavioral therapy to identify and change anxiety provoking thought and behavior patterns as well as dialectical behavior therapy to teach distress tolerance and mindfulness skills.  Goals for reducing depression will be to help the patient have less sadness as indicated  by patient report and PHQ-9 scores.  We will have goals of improved mood or returning to a healthier level of functioning as well as identify causes for depressed mood as a way to help cope with depression.  We will use a crisis plan if self-harm or suicidal thoughts occur.  Interventions include using cognitive behavioral therapy to explore and replace unhealthy thoughts contributing to depression.  We will encourage the sharing of feelings related to the causes and symptoms of depression as well as teach and encouraged the use of coping skills for managing depressive symptoms.  We will make a crisis plan and assess ongoing level of safety as needed.  Progress 20%  French Ana, [facility]                  French Ana, [facility]               French Ana, [facility]               French Ana, [facility]               French Ana, [facility]               French Ana, [facility]               French Ana, [facility]

## 2023-11-10 ENCOUNTER — Ambulatory Visit (INDEPENDENT_AMBULATORY_CARE_PROVIDER_SITE_OTHER): Payer: 59 | Admitting: Behavioral Health

## 2023-11-10 ENCOUNTER — Encounter: Payer: Self-pay | Admitting: Behavioral Health

## 2023-11-10 DIAGNOSIS — F4310 Post-traumatic stress disorder, unspecified: Secondary | ICD-10-CM

## 2023-11-10 DIAGNOSIS — F259 Schizoaffective disorder, unspecified: Secondary | ICD-10-CM

## 2023-11-10 NOTE — Progress Notes (Signed)
 [facility] Counselor/Therapist Progress Note  Patient ID: Savannah Fleming, MRN: [id number],    Date: 11/10/2023  Time Spent:   3 PM until 3:53 PM, 53 minutes spent in person with the patient in the outpatient therapist office.  Treatment Type: Individual Therapy  Reported Symptoms: Anxiety, depression  Mental Status Exam: Appearance:  Casual     Behavior: Appropriate  Motor: Normal  Speech/Language:  Normal Rate  Affect: Flat  Mood: angry, anxious, and sad  Thought process: normal  Thought content:   WNL  Sensory/Perceptual disturbances:   WNL  Orientation: oriented to person, place, time/date, situation, day of week, month of year, and year  Attention: Good  Concentration: Good  Memory: WNL  Fund of knowledge:  Good  Insight:   Good  Judgment:  Good  Impulse Control: Good   Risk Assessment: Danger to Self:  No Self-injurious Behavior: No Danger to Others: No Duty to Warn:no Physical Aggression / Violence:No  Access to Firearms a concern: No  Gang Involvement:No   Subjective: The patient said that frequently and she decided to give up that she can hide getting frustrated with her adolescent child.  She feels that she has not done a very good job of it and has decided to instead try to be kinder to herself and give herself more grace and that she has started practicing that she is finding it easier to be less frustrated with her child.  We looked at some of the factors contributing to her frustration with her child some of which she can control some somewhat which her child has to take some part in.  We talked about the balance between head and heart and how response to her child.  We begin to look more at why it is hard to be kind to herself or give herself grace based on her history.  She said with one of the teams it is working with she and her daughter for intensive in-home therapy is doing an exercise for complements to 1 criticism which she and her daughter  are supposed to do to each other.  We will use that as homework for the patient in that she has to get herself to complements or recognize things that she does well or efforts that she made that were good.  We practiced doing that in session.  The patient does have good supports in her friend Dorma Russell as well as other friends and sponsors from [employer] and [place].  She is also in a group with the Association for children of alcoholics and they are working through a workbook so she has supports of them also.  She does contract for safety.  Interventions: Cognitive Behavioral Therapy Diagnosis: Schizoaffective disorder Plan: I will meet with the patient every 2 weeks in person as possible   treatment plan:: Goals will be to reduce anxiety and depression by at least 50% to the use of cognitive behavioral therapy, dialectical behavior therapy with a target goal of February 09, 2024.  Goals for reducing stress and anxiety will be to improve her ability to manage anxiety symptoms and stress in a healthier way, identify and process causes for anxiety and explore ways to reduce it, resolve the core conflicts that are contributing to anxiety as well as help her manage thoughts and worrisome thinking contributing to feelings of anxiety.  Interventions include providing education about anxiety and stress, facilitate problem solution skills as well as teach coping skills to help manage anxiety and stress.  We  will also use cognitive behavioral therapy to identify and change anxiety provoking thought and behavior patterns as well as dialectical behavior therapy to teach distress tolerance and mindfulness skills.  Goals for reducing depression will be to help the patient have less sadness as indicated by patient report and PHQ-9 scores.  We will have goals of improved mood or returning to a healthier level of functioning as well as identify causes for depressed mood as a way to help cope with depression.  We will use a crisis plan if  self-harm or suicidal thoughts occur.  Interventions include using cognitive behavioral therapy to explore and replace unhealthy thoughts contributing to depression.  We will encourage the sharing of feelings related to the causes and symptoms of depression as well as teach and encouraged the use of coping skills for managing depressive symptoms.  We will make a crisis plan and assess ongoing level of safety as needed.  Progress 25%  French Ana, [facility]                  French Ana, [facility]               French Ana, [facility]               French Ana, [facility]               French Ana, [facility]               French Ana, [facility]               French Ana, [facility]               French Ana, [facility]

## 2023-11-25 ENCOUNTER — Ambulatory Visit: Payer: 59 | Admitting: Behavioral Health

## 2023-11-25 ENCOUNTER — Encounter: Payer: Self-pay | Admitting: Behavioral Health

## 2023-11-25 ENCOUNTER — Ambulatory Visit (INDEPENDENT_AMBULATORY_CARE_PROVIDER_SITE_OTHER): Payer: 59 | Admitting: Behavioral Health

## 2023-11-25 DIAGNOSIS — F259 Schizoaffective disorder, unspecified: Secondary | ICD-10-CM

## 2023-11-25 DIAGNOSIS — F4310 Post-traumatic stress disorder, unspecified: Secondary | ICD-10-CM

## 2023-11-25 NOTE — Progress Notes (Signed)
 [facility] Counselor/Therapist Progress Note  Patient ID: Savannah Fleming, MRN: [id number],    Date: 11/25/2023  Time Spent:   2 PM until 2:54 PM, 54 minutes spent in person with the patient in the outpatient therapist office.  Treatment Type: Individual Therapy  Reported Symptoms: Anxiety, depression  Mental Status Exam: Appearance:  Casual     Behavior: Appropriate  Motor: Normal  Speech/Language:  Normal Rate  Affect: Flat  Mood: angry, anxious, and sad  Thought process: normal  Thought content:   WNL  Sensory/Perceptual disturbances:   WNL  Orientation: oriented to person, place, time/date, situation, day of week, month of year, and year  Attention: Good  Concentration: Good  Memory: WNL  Fund of knowledge:  Good  Insight:   Good  Judgment:  Good  Impulse Control: Good   Risk Assessment: Danger to Self:  No Self-injurious Behavior: No Danger to Others: No Duty to Warn:no Physical Aggression / Violence:No  Access to Firearms a concern: No  Gang Involvement:No   Subjective: Most of the patient's anxiety and stress as well as depression is coming from what is going on with her daughter.  They are to the intensive in-home and feels there is some benefit to that but she is seeing more and more signs that indicate that her daughter's mental health issues are escalating.  The patient is working very hard to get her daughter to take her medication properly consistently also getting her to eat as that is beneficial in taking the medication.  We looked at the amount of mental emotional and physical in spiritual energy patient expense and helping care for her daughter while at the same time taking care of herself.  The patient was only able to come up with a couple of complements to give herself and we explored those as well as build on trying to get the patient to see how there are more than she is able to see. The patient does have good supports in her friend [name] as  well as other friends and sponsors from [employer] and [place].  She is also in a group with the Association for children of alcoholics and they are working through a workbook so she has supports of them also.  She does contract for safety.  Interventions: Cognitive Behavioral Therapy Diagnosis: Schizoaffective disorder Plan: I will meet with the patient every 2 weeks in person as possible   treatment plan:: Goals will be to reduce anxiety and depression by at least 50% to the use of cognitive behavioral therapy, dialectical behavior therapy with a target goal of February 09, 2024.  Goals for reducing stress and anxiety will be to improve her ability to manage anxiety symptoms and stress in a healthier way, identify and process causes for anxiety and explore ways to reduce it, resolve the core conflicts that are contributing to anxiety as well as help her manage thoughts and worrisome thinking contributing to feelings of anxiety.  Interventions include providing education about anxiety and stress, facilitate problem solution skills as well as teach coping skills to help manage anxiety and stress.  We will also use cognitive behavioral therapy to identify and change anxiety provoking thought and behavior patterns as well as dialectical behavior therapy to teach distress tolerance and mindfulness skills.  Goals for reducing depression will be to help the patient have less sadness as indicated by patient report and PHQ-9 scores.  We will have goals of improved mood or returning to a healthier level of functioning  as well as identify causes for depressed mood as a way to help cope with depression.  We will use a crisis plan if self-harm or suicidal thoughts occur.  Interventions include using cognitive behavioral therapy to explore and replace unhealthy thoughts contributing to depression.  We will encourage the sharing of feelings related to the causes and symptoms of depression as well as teach and encouraged the use of coping  skills for managing depressive symptoms.  We will make a crisis plan and assess ongoing level of safety as needed.  Progress 25%  Cecile Coder, [facility]

## 2023-12-09 ENCOUNTER — Encounter: Payer: Self-pay | Admitting: Behavioral Health

## 2023-12-09 ENCOUNTER — Ambulatory Visit (INDEPENDENT_AMBULATORY_CARE_PROVIDER_SITE_OTHER): Payer: 59 | Admitting: Behavioral Health

## 2023-12-09 DIAGNOSIS — F4310 Post-traumatic stress disorder, unspecified: Secondary | ICD-10-CM

## 2023-12-09 DIAGNOSIS — F259 Schizoaffective disorder, unspecified: Secondary | ICD-10-CM

## 2023-12-09 NOTE — Progress Notes (Signed)
 [facility] Counselor/Therapist Progress Note  Patient ID: Savannah Fleming, MRN: [id number],    Date: 12/09/2023  Time Spent:   2 PM until 2:54 PM, 54 minutes spent in person with the patient in the outpatient therapist office.  Treatment Type: Individual Therapy  Reported Symptoms: Anxiety, depression  Mental Status Exam: Appearance:  Casual     Behavior: Appropriate  Motor: Normal  Speech/Language:  Normal Rate  Affect: Flat  Mood: angry, anxious, and sad  Thought process: normal  Thought content:   WNL  Sensory/Perceptual disturbances:   WNL  Orientation: oriented to person, place, time/date, situation, day of week, month of year, and year  Attention: Good  Concentration: Good  Memory: WNL  Fund of knowledge:  Good  Insight:   Good  Judgment:  Good  Impulse Control: Good   Risk Assessment: Danger to Self:  No Self-injurious Behavior: No Danger to Others: No Duty to Warn:no Physical Aggression / Violence:No  Access to Firearms a concern: No  Gang Involvement:No   Subjective: The patient received some good news in that she will be getting child support again.  She had not gotten any in about 6 months but he has been located and that should start again soon.  There also has been some progress made in terms of family therapy and how she speaks to her child and how she disciplines but also how her child response to that.  She has found that when she does not yell which is a part of the assignment her child responds more favorably and does more around the house to help out without screaming back.  During what the patient said she was kinder to herself because her priest told her to show herself respect.  She said that the minute was over she started going back to beating herself up.  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  The patient does have good supports in her friend Savannah Fleming as well as other friends and sponsors from [employer] and [place].  She is also in a group with the Association for children of alcoholics and they are working through a workbook so she has supports of them also.  She does contract for safety.  Interventions: Cognitive Behavioral Therapy Diagnosis: Schizoaffective disorder Plan: I will meet with the patient every 2 weeks in person as possible   treatment plan:: Goals will be to reduce anxiety and depression by at least 50% to the use of cognitive behavioral therapy, dialectical behavior therapy with a target goal of February 09, 2024.  Goals for reducing stress and anxiety will be to improve her ability to manage anxiety symptoms and stress in a healthier way, identify and process causes for anxiety and explore ways to reduce it, resolve the core conflicts that are contributing to anxiety as well as help her manage thoughts and worrisome thinking contributing to feelings of anxiety.  Interventions include providing education about anxiety and stress, facilitate problem solution skills as well as teach coping skills to help manage anxiety and stress.  We will also use cognitive behavioral therapy to identify and change anxiety provoking thought and behavior patterns as well as dialectical behavior therapy to teach distress tolerance and mindfulness skills.  Goals for reducing depression will be to help the patient  have less sadness as indicated by patient report and PHQ-9 scores.  We will have goals of improved mood or returning to a healthier level of functioning as well as identify causes for depressed mood as a way to help cope with depression.  We will use a crisis plan if self-harm or suicidal thoughts occur.  Interventions include using cognitive behavioral therapy to explore and replace unhealthy thoughts contributing to depression.  We will  encourage the sharing of feelings related to the causes and symptoms of depression as well as teach and encouraged the use of coping skills for managing depressive symptoms.  We will make a crisis plan and assess ongoing level of safety as needed.  Progress 25%  Cecile Coder, [facility]                             Cecile Coder, [facility]

## 2023-12-16 ENCOUNTER — Ambulatory Visit (INDEPENDENT_AMBULATORY_CARE_PROVIDER_SITE_OTHER): Admitting: Behavioral Health

## 2023-12-16 ENCOUNTER — Encounter: Payer: Self-pay | Admitting: Behavioral Health

## 2023-12-16 DIAGNOSIS — F259 Schizoaffective disorder, unspecified: Secondary | ICD-10-CM | POA: Diagnosis not present

## 2023-12-16 DIAGNOSIS — F4310 Post-traumatic stress disorder, unspecified: Secondary | ICD-10-CM

## 2023-12-16 NOTE — Progress Notes (Signed)
 [facility] Counselor/Therapist Progress Note  Patient ID: Savannah Fleming, MRN: [id number],    Date: 12/16/2023  Time Spent: 10 AM until 10:54 AM, 54 minutes spent in person with the patient in the outpatient therapist office.  Treatment Type: Individual Therapy  Reported Symptoms: Anxiety, depression  Mental Status Exam: Appearance:  Casual     Behavior: Appropriate  Motor: Normal  Speech/Language:  Normal Rate  Affect: Flat  Mood: angry, anxious, and sad  Thought process: normal  Thought content:   WNL  Sensory/Perceptual disturbances:   WNL  Orientation: oriented to person, place, time/date, situation, day of week, month of year, and year  Attention: Good  Concentration: Good  Memory: WNL  Fund of knowledge:  Good  Insight:   Good  Judgment:  Good  Impulse Control: Good   Risk Assessment: Danger to Self:  No Self-injurious Behavior: No Danger to Others: No Duty to Warn:no Physical Aggression / Violence:No  Access to Firearms a concern: No  Gang Involvement:No   Subjective: The patient reports that she had a bit of a manic episode which lasted about 4 days.  She tried to practice what she had done in the past and that she would put something in her shopping cart and then walk away from it and she did that successfully multiple times and finally a bed.  We talked about giving her some credit for the effort increase.  She still feels that her medication is working well for her schizoaffective disorder.  She said she rarely has what she calls " the chatter" and is thankful for that.  She feels now that they need to continue to address her mood swings which they are trying to do gradually the medication.  The trade-off with having to antipsychotics is that she is not drowsy and sleepy a lot.  She is ready to go to sleep at 830 or 9:00 at night but then wakes up at 4 or 5 in the morning her daughter is asleep and she lives in an apartment.  By 1030 or 11 she is ready  for a couple hour nap.  She would rather not do that.  She will adjust to the medication changes that may have recently addressed the sleeping issue with Dr. At her next appointment.  She also is doing a better job of learning to speak kindly to herself.  They are working on that and family therapy as well as how she and her daughter speak to each other.  There is still a daughter who is degrading her that her instinct is to fight back and scream back but she feels that she is making headway in reducing that.  She knows of which she stays calm the intensity and duration of the conflict is diminished.  She even got a Mother's Day card from her daughter that told the patient and when she left that appreciate her.  She knows that her daughter values her safety with the patient does not get that type of recognition from her daughter very often and is thankful that she did The patient does have good supports in her friend [name] as well as other friends and sponsors from [employer] and [place].  She is also in a group with the Association for children of alcoholics and they are working through a workbook so she has supports of them also.  She does contract for safety.  Interventions: Cognitive Behavioral Therapy Diagnosis: Schizoaffective disorder Plan: I will meet with the patient every 2  weeks in person as possible   treatment plan:: Goals will be to reduce anxiety and depression by at least 50% to the use of cognitive behavioral therapy, dialectical behavior therapy with a target goal of February 09, 2024.  Goals for reducing stress and anxiety will be to improve her ability to manage anxiety symptoms and stress in a healthier way, identify and process causes for anxiety and explore ways to reduce it, resolve the core conflicts that are contributing to anxiety as well as help her manage thoughts and worrisome thinking contributing to feelings of anxiety.  Interventions include providing education about anxiety and stress,  facilitate problem solution skills as well as teach coping skills to help manage anxiety and stress.  We will also use cognitive behavioral therapy to identify and change anxiety provoking thought and behavior patterns as well as dialectical behavior therapy to teach distress tolerance and mindfulness skills.  Goals for reducing depression will be to help the patient have less sadness as indicated by patient report and PHQ-9 scores.  We will have goals of improved mood or returning to a healthier level of functioning as well as identify causes for depressed mood as a way to help cope with depression.  We will use a crisis plan if self-harm or suicidal thoughts occur.  Interventions include using cognitive behavioral therapy to explore and replace unhealthy thoughts contributing to depression.  We will encourage the sharing of feelings related to the causes and symptoms of depression as well as teach and encouraged the use of coping skills for managing depressive symptoms.  We will make a crisis plan and assess ongoing level of safety as needed.  Progress 25%  Cecile Coder, [facility]                             Cecile Coder, [facility]               Cecile Coder, [facility]

## 2023-12-22 ENCOUNTER — Ambulatory Visit (INDEPENDENT_AMBULATORY_CARE_PROVIDER_SITE_OTHER): Admitting: Behavioral Health

## 2023-12-22 ENCOUNTER — Encounter: Payer: Self-pay | Admitting: Behavioral Health

## 2023-12-22 DIAGNOSIS — F259 Schizoaffective disorder, unspecified: Secondary | ICD-10-CM

## 2023-12-22 DIAGNOSIS — F4310 Post-traumatic stress disorder, unspecified: Secondary | ICD-10-CM

## 2023-12-22 NOTE — Progress Notes (Signed)
 [facility] Counselor/Therapist Progress Note  Patient ID: Savannah Fleming, MRN: [id number],    Date: 12/22/2023  Time Spent: 3 PM until 3:54 PM, 54 minutes spent in person with the patient in the outpatient therapist office.  Treatment Type: Individual Therapy  Reported Symptoms: Anxiety, depression  Mental Status Exam: Appearance:  Casual     Behavior: Appropriate  Motor: Normal  Speech/Language:  Normal Rate  Affect: Flat  Mood: angry, anxious, and sad  Thought process: normal  Thought content:   WNL  Sensory/Perceptual disturbances:   WNL  Orientation: oriented to person, place, time/date, situation, day of week, month of year, and year  Attention: Good  Concentration: Good  Memory: WNL  Fund of knowledge:  Good  Insight:   Good  Judgment:  Good  Impulse Control: Good   Risk Assessment: Danger to Self:  No Self-injurious Behavior: No Danger to Others: No Duty to Warn:no Physical Aggression / Violence:No  Access to Firearms a concern: No  Gang Involvement:No   Subjective: The patient reported that over the weekend there was a day in which there were several things which contributed to her frustration and irritation.  At the end of the day her daughter was supposed to clean out the cats with her boxes.  It was something they had discussed and agreed on in their family therapy that they were supposed to alternate weeks doing that.  First her daughter lied to her and then refused to do what she was supposed to do and the conversation escalated verbally.  The patient said that she recognized that she did not handle it well and started to jump the tray of kiddie later on her daughter's head and frustration but called herself and a couple of grains of cat litter did land on her daughter.  She recognized immediately and stopped.  Her daughter called the family therapist and they talked through it with the family therapist.  The therapist said that an appropriate  punishment because the daughter lives in disobeyed would be taking 20 minutes of the daughter's phone time away.  The daughter did not want to give the phone up but eventually they were able to talk through it before the night was over.  We talked about appropriate response to the situation and any other situation which is something they are working on with the family therapist.  We talked about the patient acknowledging that she did not handle that situation well but the fact that 60% of the time with the help of family therapy she and her daughter are handling things much better and they do not escalate.  The therapist pointed out that her daughter is not running anymore and that significant progress.  We will continue to work on the patient challenging her parents choices in her head and giving herself credit for the work that she is putting in terms of being different on them and the way she parents. Interventions: Cognitive Behavioral Therapy Diagnosis: Schizoaffective disorder Plan: I will meet with the patient every 2 weeks in person as possible   treatment plan:: Goals will be to reduce anxiety and depression by at least 50% to the use of cognitive behavioral therapy, dialectical behavior therapy with a target goal of February 09, 2024.  Goals for reducing stress and anxiety will be to improve her ability to manage anxiety symptoms and stress in a healthier way, identify and process causes for anxiety and explore ways to reduce it, resolve the core conflicts that are  contributing to anxiety as well as help her manage thoughts and worrisome thinking contributing to feelings of anxiety.  Interventions include providing education about anxiety and stress, facilitate problem solution skills as well as teach coping skills to help manage anxiety and stress.  We will also use cognitive behavioral therapy to identify and change anxiety provoking thought and behavior patterns as well as dialectical behavior therapy  to teach distress tolerance and mindfulness skills.  Goals for reducing depression will be to help the patient have less sadness as indicated by patient report and PHQ-9 scores.  We will have goals of improved mood or returning to a healthier level of functioning as well as identify causes for depressed mood as a way to help cope with depression.  We will use a crisis plan if self-harm or suicidal thoughts occur.  Interventions include using cognitive behavioral therapy to explore and replace unhealthy thoughts contributing to depression.  We will encourage the sharing of feelings related to the causes and symptoms of depression as well as teach and encouraged the use of coping skills for managing depressive symptoms.  We will make a crisis plan and assess ongoing level of safety as needed.  Progress 35%  Cecile Coder, [facility]                             Cecile Coder, [facility]               Cecile Coder, [facility]               Cecile Coder, [facility]

## 2023-12-30 ENCOUNTER — Ambulatory Visit (INDEPENDENT_AMBULATORY_CARE_PROVIDER_SITE_OTHER): Admitting: Behavioral Health

## 2023-12-30 ENCOUNTER — Encounter: Payer: Self-pay | Admitting: Behavioral Health

## 2023-12-30 DIAGNOSIS — F259 Schizoaffective disorder, unspecified: Secondary | ICD-10-CM

## 2023-12-30 DIAGNOSIS — F4310 Post-traumatic stress disorder, unspecified: Secondary | ICD-10-CM

## 2023-12-30 NOTE — Progress Notes (Signed)
 [facility] Counselor/Therapist Progress Note  Patient ID: Savannah Fleming, MRN: [id number],    Date: 12/30/2023  Time Spent: 10 AM until 10:50 AM, 50 minutes spent in person with the patient in the outpatient therapist office.  Treatment Type: Individual Therapy  Reported Symptoms: Anxiety, depression  Mental Status Exam: Appearance:  Casual     Behavior: Appropriate  Motor: Normal  Speech/Language:  Normal Rate  Affect: Flat  Mood: angry, anxious, and sad  Thought process: normal  Thought content:   WNL  Sensory/Perceptual disturbances:   WNL  Orientation: oriented to person, place, time/date, situation, day of week, month of year, and year  Attention: Good  Concentration: Good  Memory: WNL  Fund of knowledge:  Good  Insight:   Good  Judgment:  Good  Impulse Control: Good   Risk Assessment: Danger to Self:  No Self-injurious Behavior: No Danger to Others: No Duty to Warn:no Physical Aggression / Violence:No  Access to Firearms a concern: No  Gang Involvement:No   Subjective: The patient reported that for the most part the past week things up at home.  When asked that she is not manic phase and 2 nights ago was up all night cleaning while manic.  She appreciates the cleanliness but does not like the mania.  She does a much better job of recognizing what is coming on and feels the medication does keep it manageable.  She did get a headache completely cleaning the bathroom which her daughter is supposed to do and knows in some way that is enabling but she could not stand how dirty it was anymore.  I encouraged her to speak to the family therapist who works with she and her child about the best way to address that.  She recognizes that the calmer She stays, the less her child escalates.  We talked about some ways that she tries to keep herself calm at some ways that like help in those situations.  She recognizes that at times she can set that can inflamed the  situation.  We looked at the mental and emotional approach to her response to her child's escalation.  Interventions: Cognitive Behavioral Therapy Diagnosis: Schizoaffective disorder Plan: I will meet with the patient every 2 weeks in person as possible   treatment plan:: Goals will be to reduce anxiety and depression by at least 50% to the use of cognitive behavioral therapy, dialectical behavior therapy with a target goal of February 09, 2024.  Goals for reducing stress and anxiety will be to improve her ability to manage anxiety symptoms and stress in a healthier way, identify and process causes for anxiety and explore ways to reduce it, resolve the core conflicts that are contributing to anxiety as well as help her manage thoughts and worrisome thinking contributing to feelings of anxiety.  Interventions include providing education about anxiety and stress, facilitate problem solution skills as well as teach coping skills to help manage anxiety and stress.  We will also use cognitive behavioral therapy to identify and change anxiety provoking thought and behavior patterns as well as dialectical behavior therapy to teach distress tolerance and mindfulness skills.  Goals for reducing depression will be to help the patient have less sadness as indicated by patient report and PHQ-9 scores.  We will have goals of improved mood or returning to a healthier level of functioning as well as identify causes for depressed mood as a way to help cope with depression.  We will use a crisis plan if  self-harm or suicidal thoughts occur.  Interventions include using cognitive behavioral therapy to explore and replace unhealthy thoughts contributing to depression.  We will encourage the sharing of feelings related to the causes and symptoms of depression as well as teach and encouraged the use of coping skills for managing depressive symptoms.  We will make a crisis plan and assess ongoing level of safety as  needed.  Progress 35%  Cecile Coder, [facility]                             Cecile Coder, [facility]               Cecile Coder, [facility]               Cecile Coder, [facility]               Cecile Coder, [facility]

## 2024-01-02 ENCOUNTER — Telehealth: Payer: Self-pay | Admitting: Behavioral Health

## 2024-01-02 NOTE — Telephone Encounter (Signed)
 I returned the patient's phone call.  2 days ago her daughter took an overdose of pills but did not tell the patient.  She had allowed the daughter to spend time with her friend and the daughter did tell a friend that she overdosed but the friend did not think about it.  The patient's daughter passed out and started vomiting and she was taken to the hospital immediately.  Thankfully she is okay and is now at a behavioral health facility.  The patient is understandably overwhelmed we processed what happened.  I told her that she did what she needed to do as a mom and supporting her daughter.  We talked about the importance especially as her daughter is in the behavioral health unit for self-care for the patient.  The patient does contract for safety.  She knows that her office is closed on Monday for [facility] but she is scheduled with me on Tuesday of next week.  She also knows that she can call [phone] if she needs someone to speak to her over the next few days.

## 2024-01-06 ENCOUNTER — Encounter: Payer: Self-pay | Admitting: Behavioral Health

## 2024-01-06 ENCOUNTER — Ambulatory Visit (INDEPENDENT_AMBULATORY_CARE_PROVIDER_SITE_OTHER): Admitting: Behavioral Health

## 2024-01-06 DIAGNOSIS — F259 Schizoaffective disorder, unspecified: Secondary | ICD-10-CM | POA: Diagnosis not present

## 2024-01-06 DIAGNOSIS — F4310 Post-traumatic stress disorder, unspecified: Secondary | ICD-10-CM

## 2024-01-06 NOTE — Progress Notes (Signed)
 [facility] Counselor/Therapist Progress Note  Patient ID: Savannah Fleming, MRN: [id number],    Date: 01/06/2024  Time Spent: 2 PM until 2:51 PM, 51 minutes spent in person with the patient in the outpatient therapist office.  Treatment Type: Individual Therapy  Reported Symptoms: Anxiety, depression  Mental Status Exam: Appearance:  Casual     Behavior: Appropriate  Motor: Normal  Speech/Language:  Normal Rate  Affect: Flat  Mood: angry, anxious, and sad  Thought process: normal  Thought content:   WNL  Sensory/Perceptual disturbances:   WNL  Orientation: oriented to person, place, time/date, situation, day of week, month of year, and year  Attention: Good  Concentration: Good  Memory: WNL  Fund of knowledge:  Good  Insight:   Good  Judgment:  Good  Impulse Control: Good   Risk Assessment: Danger to Self:  No Self-injurious Behavior: No Danger to Others: No Duty to Warn:no Physical Aggression / Violence:No  Access to Firearms a concern: No  Gang Involvement:No   Subjective: The patient's child will be dismissed from the hospital probably tomorrow.  She has been there about a week and she hopes that her child has benefited from the state.  This is not the first visit.  She said that she feels that her child has taken this time much more seriously.  They also will meet with her intensive in-home therapist 2 days this week after her daughter gets home and talked about what it looks like moving forward.  We talked about the importance of the patient using her coping skills including mindfulness to stay as calm as possible with her child.  She has done a lot in preparation for her coming home including cleaning everything with hopes that her child will buy into that at all up to her end of the bargain of keeping her room clean and doing a few other things in exchange for getting a ferret at as a reward.  Patient sees the blessings that has taken place especially over  the past week either directly or indirectly and knows that it has got taking care of her.  She says she feels like she does not deserve that in her friend [name] keeps telling her that God's love is not transactional or conditional but she has a hard time believing that.  We talked about some of her beliefs how she can start to except the fact that if she is a believer God is going to take care of her and ways that she can necessarily understand that can learn to accept and appreciate.  She does contract for safety having no thoughts of hurting herself or anyone else.  Interventions: Cognitive Behavioral Therapy Diagnosis: Schizoaffective disorder Plan: I will meet with the patient every 2 weeks in person as possible   treatment plan:: Goals will be to reduce anxiety and depression by at least 50% to the use of cognitive behavioral therapy, dialectical behavior therapy with a target goal of February 09, 2024.  Goals for reducing stress and anxiety will be to improve her ability to manage anxiety symptoms and stress in a healthier way, identify and process causes for anxiety and explore ways to reduce it, resolve the core conflicts that are contributing to anxiety as well as help her manage thoughts and worrisome thinking contributing to feelings of anxiety.  Interventions include providing education about anxiety and stress, facilitate problem solution skills as well as teach coping skills to help manage anxiety and stress.  We will also  use cognitive behavioral therapy to identify and change anxiety provoking thought and behavior patterns as well as dialectical behavior therapy to teach distress tolerance and mindfulness skills.  Goals for reducing depression will be to help the patient have less sadness as indicated by patient report and PHQ-9 scores.  We will have goals of improved mood or returning to a healthier level of functioning as well as identify causes for depressed mood as a way to help cope with  depression.  We will use a crisis plan if self-harm or suicidal thoughts occur.  Interventions include using cognitive behavioral therapy to explore and replace unhealthy thoughts contributing to depression.  We will encourage the sharing of feelings related to the causes and symptoms of depression as well as teach and encouraged the use of coping skills for managing depressive symptoms.  We will make a crisis plan and assess ongoing level of safety as needed.  Progress 35%  Cecile Coder, [facility]                             Cecile Coder, [facility]               Cecile Coder, [facility]               Cecile Coder, [facility]               Cecile Coder, [facility]               Cecile Coder, [facility]

## 2024-01-12 ENCOUNTER — Encounter: Payer: Self-pay | Admitting: Behavioral Health

## 2024-01-12 ENCOUNTER — Ambulatory Visit (INDEPENDENT_AMBULATORY_CARE_PROVIDER_SITE_OTHER): Admitting: Behavioral Health

## 2024-01-12 DIAGNOSIS — F259 Schizoaffective disorder, unspecified: Secondary | ICD-10-CM | POA: Diagnosis not present

## 2024-01-12 DIAGNOSIS — F4310 Post-traumatic stress disorder, unspecified: Secondary | ICD-10-CM

## 2024-01-12 NOTE — Progress Notes (Signed)
 [facility] Counselor/Therapist Progress Note  Patient ID: Natalea Sutliff, MRN: [id number],    Date: 01/12/2024  Time Spent: 2 PM until 2:54 PM, 54 minutes spent in person with the patient in the outpatient therapist office.  Treatment Type: Individual Therapy  Reported Symptoms: Anxiety, depression  Mental Status Exam: Appearance:  Casual     Behavior: Appropriate  Motor: Normal  Speech/Language:  Normal Rate  Affect: Flat  Mood: angry, anxious, and sad  Thought process: normal  Thought content:   WNL  Sensory/Perceptual disturbances:   WNL  Orientation: oriented to person, place, time/date, situation, day of week, month of year, and year  Attention: Good  Concentration: Good  Memory: WNL  Fund of knowledge:  Good  Insight:   Good  Judgment:  Good  Impulse Control: Good   Risk Assessment: Danger to Self:  No Self-injurious Behavior: No Danger to Others: No Duty to Warn:no Physical Aggression / Violence:No  Access to Firearms a concern: No  Gang Involvement:No   Subjective: The patient's child was discharged from the hospital last week and she says she does not necessarily know if any significant changes.  We are intensive in-home therapist is encouraging the patient saying she is seeing small incremental changes over the time so I encouraged the patient to focus on those small changes that they can see taking place.  The patient says that she feels like she has let go of some of the things in her life because she feels she constantly has to be on guard with what might happen with daughter.  She has some fear of leaving her alone because of things that have happened in the past.  We will use cognitive reframing to talk about the patient's mindset going from Like she is fading away to beginning to take care of herself.  She said for example she wakes up around 4:00 in the morning and cannot do anything much until late morning when her daughter wakes up.  She has  tried to include her daughter and a lot of things including going to the gym in the morning or participate.  I suggest that she might before which she is willing to do get help, take her medication, make breakfast at the rest of her medication and then get back into a routine around the home tiptoeing around daughter sleeping The choice to stay up late choice to sleep late.  Also talked about restricting some of those things her daughter has electronics possibly through parental controls.  There is a program goals which the patient said she needs to set her daughter up for which would give her something to do to get her out of the house.  The patient says she has had to give up his unpredictability of her daughter's choices.  We talked about how she can begin to take her life back at least on a small basis for now. She does contract for safety having no thoughts of hurting herself or anyone else.  Interventions: Cognitive Behavioral Therapy Diagnosis: Schizoaffective disorder Plan: I will meet with the patient every 2 weeks in person as possible   treatment plan:: Goals will be to reduce anxiety and depression by at least 50% to the use of cognitive behavioral therapy, dialectical behavior therapy with a target goal of February 09, 2024.  Goals for reducing stress and anxiety will be to improve her ability to manage anxiety symptoms and stress in a healthier way, identify and process causes for anxiety and  explore ways to reduce it, resolve the core conflicts that are contributing to anxiety as well as help her manage thoughts and worrisome thinking contributing to feelings of anxiety.  Interventions include providing education about anxiety and stress, facilitate problem solution skills as well as teach coping skills to help manage anxiety and stress.  We will also use cognitive behavioral therapy to identify and change anxiety provoking thought and behavior patterns as well as dialectical behavior therapy to  teach distress tolerance and mindfulness skills.  Goals for reducing depression will be to help the patient have less sadness as indicated by patient report and PHQ-9 scores.  We will have goals of improved mood or returning to a healthier level of functioning as well as identify causes for depressed mood as a way to help cope with depression.  We will use a crisis plan if self-harm or suicidal thoughts occur.  Interventions include using cognitive behavioral therapy to explore and replace unhealthy thoughts contributing to depression.  We will encourage the sharing of feelings related to the causes and symptoms of depression as well as teach and encouraged the use of coping skills for managing depressive symptoms.  We will make a crisis plan and assess ongoing level of safety as needed.  Progress 35%  Cecile Coder, [facility]                             Cecile Coder, [facility]               Cecile Coder, [facility]               Cecile Coder, [facility]               Cecile Coder, [facility]               Cecile Coder, [facility]               Cecile Coder, [facility]

## 2024-01-20 ENCOUNTER — Encounter: Payer: Self-pay | Admitting: Behavioral Health

## 2024-01-20 ENCOUNTER — Ambulatory Visit (INDEPENDENT_AMBULATORY_CARE_PROVIDER_SITE_OTHER): Admitting: Behavioral Health

## 2024-01-20 DIAGNOSIS — F4310 Post-traumatic stress disorder, unspecified: Secondary | ICD-10-CM

## 2024-01-20 DIAGNOSIS — F259 Schizoaffective disorder, unspecified: Secondary | ICD-10-CM
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
## 2024-01-26 ENCOUNTER — Encounter: Payer: Self-pay | Admitting: Behavioral Health

## 2024-01-26 ENCOUNTER — Ambulatory Visit (INDEPENDENT_AMBULATORY_CARE_PROVIDER_SITE_OTHER): Admitting: Behavioral Health

## 2024-01-26 DIAGNOSIS — F4310 Post-traumatic stress disorder, unspecified: Secondary | ICD-10-CM

## 2024-01-26 DIAGNOSIS — F259 Schizoaffective disorder, unspecified: Secondary | ICD-10-CM
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
## 2024-02-03 ENCOUNTER — Encounter: Payer: Self-pay | Admitting: Behavioral Health

## 2024-02-03 ENCOUNTER — Ambulatory Visit (INDEPENDENT_AMBULATORY_CARE_PROVIDER_SITE_OTHER): Admitting: Behavioral Health

## 2024-02-03 DIAGNOSIS — F259 Schizoaffective disorder, unspecified: Secondary | ICD-10-CM | POA: Diagnosis not present

## 2024-02-03 DIAGNOSIS — F4310 Post-traumatic stress disorder, unspecified: Secondary | ICD-10-CM

## 2024-02-03 NOTE — Progress Notes (Signed)
 [facility] Counselor/Therapist Progress Note  Patient ID: Savannah Fleming, MRN: [id number],    Date: 02/03/2024  Time Spent: 3 PM until 3:48 PM, 48 minutes spent in person with the patient in the outpatient therapist office.  Treatment Type: Individual Therapy  Reported Symptoms: Anxiety, depression  Mental Status Exam: Appearance:  Casual     Behavior: Appropriate  Motor: Normal  Speech/Language:  Normal Rate  Affect: Flat  Mood: angry, anxious, and sad  Thought process: normal  Thought content:   WNL  Sensory/Perceptual disturbances:   WNL  Orientation: oriented to person, place, time/date, situation, day of week, month of year, and year  Attention: Good  Concentration: Good  Memory: WNL  Fund of knowledge:  Good  Insight:   Good  Judgment:  Good  Impulse Control: Good   Risk Assessment: Danger to Self:  No Self-injurious Behavior: No Danger to Others: No Duty to Warn:no Physical Aggression / Violence:No  Access to Firearms a concern: No  Gang Involvement:No   Subjective: The patient said an argument between she and her daughter made her realize that she still had some work to do standing her calm around her daughter.  Another situation took place in which her daughter was not doing a very simple chore the patient asked her to do so she gave her options of doing mature, losing her phone for 20 minutes, calling her phone provider having her phone cut off certain amount of time.  The daughter did eventually surrender her phone in an angry way but we talked about the importance of the consistency of that boundary and how the patient staying calm in that situation can start to shape her interactions with her daughter.  She also is wrestling with her weight knowing that her psych meds contribute to her weight.  She has stopped saying negative things about herself while looking in the mirror but we started to reframe how she sees herself in more than just a physical  sense.  We talked about situations including voices from her past especially her father and in a different relationship affect how she sees her weight.  She is doing a lot of good things including drinking more water, eating healthier.  Much like with her daughter she is using cognitive reframing.  With her daughter she is telling herself that her daughter is not her father and that word spoken or not the truth and we apply that to her weight.  We also talked about looking herself holistically as opposed to comparing herself to other people who have lost more weight or smaller She does contract for safety having no thoughts of hurting herself or anyone else.  Interventions: Cognitive Behavioral Therapy Diagnosis: Schizoaffective disorder Plan: I will meet with the patient every 2 weeks in person as possible   treatment plan:: Goals will be to reduce anxiety and depression by at least 50% to the use of cognitive behavioral therapy, dialectical behavior therapy with a target goal of February 09, 2024.  Goals for reducing stress and anxiety will be to improve her ability to manage anxiety symptoms and stress in a healthier way, identify and process causes for anxiety and explore ways to reduce it, resolve the core conflicts that are contributing to anxiety as well as help her manage thoughts and worrisome thinking contributing to feelings of anxiety.  Interventions include providing education about anxiety and stress, facilitate problem solution skills as well as teach coping skills to help manage anxiety and stress.  We  will also use cognitive behavioral therapy to identify and change anxiety provoking thought and behavior patterns as well as dialectical behavior therapy to teach distress tolerance and mindfulness skills.  Goals for reducing depression will be to help the patient have less sadness as indicated by patient report and PHQ-9 scores.  We will have goals of improved mood or returning to a healthier  level of functioning as well as identify causes for depressed mood as a way to help cope with depression.  We will use a crisis plan if self-harm or suicidal thoughts occur.  Interventions include using cognitive behavioral therapy to explore and replace unhealthy thoughts contributing to depression.  We will encourage the sharing of feelings related to the causes and symptoms of depression as well as teach and encouraged the use of coping skills for managing depressive symptoms.  We will make a crisis plan and assess ongoing level of safety as needed.  Progress 35%  Lorrene CHRISTELLA Hasten, [facility]                             Lorrene CHRISTELLA Hasten, [facility]               Lorrene CHRISTELLA Hasten, [facility]               Lorrene CHRISTELLA Hasten, [facility]               Lorrene CHRISTELLA Hasten, [facility]               Lorrene CHRISTELLA Hasten, [facility]               Lorrene CHRISTELLA Hasten, [facility]               Lorrene CHRISTELLA Hasten, [facility]               Lorrene CHRISTELLA Hasten, [facility]               Lorrene CHRISTELLA Hasten, [facility]

## 2024-02-10 ENCOUNTER — Encounter: Payer: Self-pay | Admitting: Behavioral Health

## 2024-02-10 ENCOUNTER — Ambulatory Visit (INDEPENDENT_AMBULATORY_CARE_PROVIDER_SITE_OTHER): Admitting: Behavioral Health

## 2024-02-10 DIAGNOSIS — F259 Schizoaffective disorder, unspecified: Secondary | ICD-10-CM | POA: Diagnosis not present

## 2024-02-10 DIAGNOSIS — F4310 Post-traumatic stress disorder, unspecified: Secondary | ICD-10-CM

## 2024-02-10 NOTE — Progress Notes (Signed)
 [place] [place] [facility] [facility] Counselor/Therapist Progress Note  Patient ID: Savannah Fleming, MRN: [id number],    Date: 02/10/2024  Time Spent: 3 PM until 3:48 PM, 48 minutes spent in person with the patient in the outpatient therapist office.  Treatment Type: Individual Therapy  Reported Symptoms: Anxiety, depression  Mental Status Exam: Appearance:  Casual     Behavior: Appropriate  Motor: Normal  Speech/Language:  Normal Rate  Affect: Flat  Mood: angry, anxious, and sad  Thought process: normal  Thought content:   WNL  Sensory/Perceptual disturbances:   WNL  Orientation: oriented to person, place, time/date, situation, day of week, month of year, and year  Attention: Good  Concentration: Good  Memory: WNL  Fund of knowledge:  Good  Insight:   Good  Judgment:  Good  Impulse Control: Good   Risk Assessment: Danger to Self:  No Self-injurious Behavior: No Danger to Others: No Duty to Warn:no Physical Aggression / Violence:No  Access to Firearms a concern: No  Gang Involvement:No   Subjective: The patient feels she continues to make some positive strides.  She met with the bariatric doctor and she had gained some weight but she is being honest with herself.  There are still times where she emotionally engaged but says she is much more mindful of when that is and how often it is.  He is learning to give herself grace.  He is learning better how to respond to her daughter and she can see consistently when she stays calm that her daughter does not react as strongly.  She is challenging some thoughts that date back to childhood from what her mother and father told her.  For years she believes certain lies about herself but is starting to see that is not the truth and is starting to see who she is apart from who she was told she should be.  She is going to go back to the gym.  She is eating healthier.  She has applied for a part-time job which the intensive in-home family therapist  encouraged her to do.  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  She has decided to take steps to pursue going back to school to get her graduate degree.  She understands that there are roadblocks that she will have to hurl including time money but she wants to put herself in a healthier position.  I encouraged her to go to the gym early in the mornings as she had been doing pushing past some of the trust issues with her daughter staying at home by herself for an hour or so.  She recognizes that her daughter knows the right things to do and knows how to keep herself safe.  She does contract for safety having no thoughts of hurting herself or anyone else.  Interventions: Cognitive Behavioral Therapy Diagnosis: Schizoaffective disorder Plan: I will meet with the patient every 2 weeks in person as possible   treatment plan:: Goals will be to reduce anxiety and depression by at least 50% to the use of cognitive behavioral therapy, dialectical behavior therapy with a target goal of February 09, 2024.  Goals for reducing stress and anxiety will be to improve her ability to manage anxiety symptoms and stress in a healthier way, identify and process causes for anxiety and explore ways to reduce it, resolve the core conflicts that are contributing to anxiety as well as help her manage thoughts and worrisome thinking contributing to feelings  of anxiety.  Interventions include providing education about anxiety and stress, facilitate problem solution skills as well as teach coping skills to help manage anxiety and stress.  We will also use cognitive behavioral therapy to identify and change anxiety provoking thought and behavior patterns as well as dialectical behavior therapy to teach distress tolerance and mindfulness skills.  Goals for reducing depression will be to help the patient have less sadness as indicated by patient report and PHQ-9 scores.  We will have goals of improved mood or  returning to a healthier level of functioning as well as identify causes for depressed mood as a way to help cope with depression.  We will use a crisis plan if self-harm or suicidal thoughts occur.  Interventions include using cognitive behavioral therapy to explore and replace unhealthy thoughts contributing to depression.  We will encourage the sharing of feelings related to the causes and symptoms of depression as well as teach and encouraged the use of coping skills for managing depressive symptoms.  We will make a crisis plan and assess ongoing level of safety as needed.  Progress 35%  Lorrene CHRISTELLA Hasten, [facility]                             Lorrene CHRISTELLA Hasten, [facility]               Lorrene CHRISTELLA Hasten, [facility]               Lorrene CHRISTELLA Hasten, [facility]               Lorrene CHRISTELLA Hasten, [facility]               Lorrene CHRISTELLA Hasten, [facility]               Lorrene CHRISTELLA Hasten, [facility]               Lorrene CHRISTELLA Hasten, [facility]               Lorrene CHRISTELLA Hasten, [facility]               Lorrene CHRISTELLA Hasten, [facility]               Lorrene CHRISTELLA Hasten, [facility]

## 2024-02-16 ENCOUNTER — Ambulatory Visit: Admitting: Behavioral Health

## 2024-02-17 ENCOUNTER — Telehealth: Payer: Self-pay | Admitting: Behavioral Health

## 2024-02-17 NOTE — Telephone Encounter (Signed)
 Error

## 2024-02-23 ENCOUNTER — Ambulatory Visit (INDEPENDENT_AMBULATORY_CARE_PROVIDER_SITE_OTHER): Admitting: Behavioral Health

## 2024-02-23 ENCOUNTER — Encounter: Payer: Self-pay | Admitting: Behavioral Health

## 2024-02-23 DIAGNOSIS — F259 Schizoaffective disorder, unspecified: Secondary | ICD-10-CM | POA: Diagnosis not present

## 2024-02-23 DIAGNOSIS — F4310 Post-traumatic stress disorder, unspecified: Secondary | ICD-10-CM
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
## 2024-03-01 ENCOUNTER — Ambulatory Visit: Admitting: Behavioral Health

## 2024-03-02 ENCOUNTER — Encounter: Payer: Self-pay | Admitting: Behavioral Health

## 2024-03-02 ENCOUNTER — Ambulatory Visit (INDEPENDENT_AMBULATORY_CARE_PROVIDER_SITE_OTHER): Admitting: Behavioral Health

## 2024-03-02 DIAGNOSIS — F259 Schizoaffective disorder, unspecified: Secondary | ICD-10-CM

## 2024-03-02 DIAGNOSIS — F4310 Post-traumatic stress disorder, unspecified: Secondary | ICD-10-CM

## 2024-03-02 NOTE — Progress Notes (Signed)
 [facility] Counselor/Therapist Progress Note  Patient ID: Kristian Hazzard, MRN: [id number],    Date: 03/02/2024  Time Spent: 9 AM until 9:44 AM, 44 minutes spent in person with the patient in the outpatient therapist office.  Treatment Type: Individual Therapy  Reported Symptoms: Anxiety, depression  Mental Status Exam: Appearance:  Casual     Behavior: Appropriate  Motor: Normal  Speech/Language:  Normal Rate  Affect: Flat  Mood: angry, anxious, and sad  Thought process: normal  Thought content:   WNL  Sensory/Perceptual disturbances:   WNL  Orientation: oriented to person, place, time/date, situation, day of week, month of year, and year  Attention: Good  Concentration: Good  Memory: WNL  Fund of knowledge:  Good  Insight:   Good  Judgment:  Good  Impulse Control: Good   Risk Assessment: Danger to Self:  No Self-injurious Behavior: No Danger to Others: No Duty to Warn:no Physical Aggression / Violence:No  Access to Firearms a concern: No  Gang Involvement:No   Subjective: The patient was tired because she did not get much sleep last night.  She said that actually her body prefers to sleep during the day and be awake at night but she cannot do that now.  Her daughter woke her up at 3:00 in the morning asking to be taken to a she says so she can get a Pepsi.  The patient said that she told her to go back to bed and only her daughter would react to strongly but did not in this case.  Although the progress is slow she can see some change in her daughter in terms of her emotional stability.  This week has been time because the daughter found out that-year-old friends who had been through a lot of trauma committed suicide while in a psychiatric hospital.  He had told her he was going to do and send pictures of where he was and they were able to have the police in that area track him down to get him to the psychiatric hospital.  She said that both she and her daughter  are grieving.  The patient said does trigger some stuff because of the times that her daughter has run away and especially wanted where it was for several days and her daughter was with someone else.  She does say that her daughter is not running away nearly as much as she did and she considers a progress to him.  The patient is still working on her CV fee and I encouraged her to continue to focus on those things which can bring some positivity to her life.  She continues to use the coping skills including the tips and skills from dialectical behavior therapy when feeling overwhelmed. She does contract for safety having no thoughts of hurting herself or anyone else.  Interventions: Cognitive Behavioral Therapy Diagnosis: Schizoaffective disorder Plan: I will meet with the patient every 2 weeks in person as possible   treatment plan:: Goals will be to reduce anxiety and depression by at least 50% to the use of cognitive behavioral therapy, dialectical behavior therapy with a target goal of December 31st, 2025.  Goals for reducing stress and anxiety will be to improve her ability to manage anxiety symptoms and stress in a healthier way, identify and process causes for anxiety and explore ways to reduce it, resolve the core conflicts that are contributing to anxiety as well as help her manage thoughts and worrisome thinking contributing to feelings of anxiety.  Interventions  include providing education about anxiety and stress, facilitate problem solution skills as well as teach coping skills to help manage anxiety and stress.  We will also use cognitive behavioral therapy to identify and change anxiety provoking thought and behavior patterns as well as dialectical behavior therapy to teach distress tolerance and mindfulness skills.  Goals for reducing depression will be to help the patient have less sadness as indicated by patient report and PHQ-9 scores.  We will have goals of improved mood or returning to  a healthier level of functioning as well as identify causes for depressed mood as a way to help cope with depression.  We will use a crisis plan if self-harm or suicidal thoughts occur.  Interventions include using cognitive behavioral therapy to explore and replace unhealthy thoughts contributing to depression.  We will encourage the sharing of feelings related to the causes and symptoms of depression as well as teach and encouraged the use of coping skills for managing depressive symptoms.  We will make a crisis plan and assess ongoing level of safety as needed.  Progress 35%  Lorrene CHRISTELLA Hasten, [facility]                             Lorrene CHRISTELLA Hasten, [facility]               Lorrene CHRISTELLA Hasten, [facility]               Lorrene CHRISTELLA Hasten, [facility]               Lorrene CHRISTELLA Hasten, [facility]               Lorrene CHRISTELLA Hasten, [facility]               Lorrene CHRISTELLA Hasten, [facility]               Lorrene CHRISTELLA Hasten, [facility]               Lorrene CHRISTELLA Hasten, [facility]               Lorrene CHRISTELLA Hasten, [facility]               Lorrene CHRISTELLA Hasten, [facility]               Lorrene CHRISTELLA Hasten, [facility]               Lorrene CHRISTELLA Hasten, [facility]

## 2024-03-08 ENCOUNTER — Ambulatory Visit: Admitting: Behavioral Health

## 2024-03-15 ENCOUNTER — Encounter: Payer: Self-pay | Admitting: Behavioral Health

## 2024-03-15 ENCOUNTER — Ambulatory Visit (INDEPENDENT_AMBULATORY_CARE_PROVIDER_SITE_OTHER): Admitting: Behavioral Health

## 2024-03-15 DIAGNOSIS — F259 Schizoaffective disorder, unspecified: Secondary | ICD-10-CM

## 2024-03-15 DIAGNOSIS — F4310 Post-traumatic stress disorder, unspecified: Secondary | ICD-10-CM
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
## 2024-03-23 ENCOUNTER — Encounter: Payer: Self-pay | Admitting: Behavioral Health

## 2024-03-23 ENCOUNTER — Ambulatory Visit (INDEPENDENT_AMBULATORY_CARE_PROVIDER_SITE_OTHER): Admitting: Behavioral Health

## 2024-03-23 DIAGNOSIS — F259 Schizoaffective disorder, unspecified: Secondary | ICD-10-CM | POA: Diagnosis not present

## 2024-03-23 DIAGNOSIS — F4310 Post-traumatic stress disorder, unspecified: Secondary | ICD-10-CM

## 2024-03-23 NOTE — Progress Notes (Signed)
 [facility] Counselor/Therapist Progress Note  Patient ID: Savannah Fleming, MRN: [id number],    Date: 03/23/2024  Time Spent: 3;01 PM until 3:51 PM, 51 minutes spent in person with the patient in the outpatient therapist office.  Treatment Type: Individual Therapy  Reported Symptoms: Anxiety, depression  Mental Status Exam: Appearance:  Casual     Behavior: Appropriate  Motor: Normal  Speech/Language:  Normal Rate  Affect: Flat  Mood: angry, anxious, and sad  Thought process: normal  Thought content:   WNL  Sensory/Perceptual disturbances:   WNL  Orientation: oriented to person, place, time/date, situation, day of week, month of year, and year  Attention: Good  Concentration: Good  Memory: WNL  Fund of knowledge:  Good  Insight:   Good  Judgment:  Good  Impulse Control: Good   Risk Assessment: Danger to Self:  No Self-injurious Behavior: No Danger to Others: No Duty to Warn:no Physical Aggression / Violence:No  Access to Firearms a concern: No  Gang Involvement:No   Subjective: The patient had a very good celebration of her 17 years of sobriety and felt loved.  People who had been important to her and who she has been important to where they are and celebrated.  Her daughter spoke, painted something for her and made a cake.  Her friend took she and her daughter out to dinner.  She continues to be challenged by her daughters choices.  She recognizes that her daughter is difficult to parent and that she has been a good parent but still was not very kind to herself and is constantly looking for ways to be a better parent.  We talked about some very assertive and firm boundaries and instructions that may be helpful in dealing with her daughter.  She understands that her daughter has some anxieties and fears we talked about some ways even asking about right now she can be of support to her daughter while at the same time holding her accountable.  She also indicated  that a visit to her endocrinologist made her question how good she is to herself.  She wants to learn to love herself better including physically mentally emotionally spiritually etc.  She wants to start with physical so I encouraged her when she dropped her daughter off to school starting tomorrow to make the [employer] her first stop.  We also looked at other ways holistically that can she can start to reaffirm her love for herself and believe more in herself.  She does contract for safety having no thoughts of hurting herself or anyone else.  Interventions: Cognitive Behavioral Therapy Diagnosis: Schizoaffective disorder Plan: I will meet with the patient every 2 weeks in person as possible   treatment plan:: Goals will be to reduce anxiety and depression by at least 50% to the use of cognitive behavioral therapy, dialectical behavior therapy with a target goal of December 31st, 2025.  Goals for reducing stress and anxiety will be to improve her ability to manage anxiety symptoms and stress in a healthier way, identify and process causes for anxiety and explore ways to reduce it, resolve the core conflicts that are contributing to anxiety as well as help her manage thoughts and worrisome thinking contributing to feelings of anxiety.  Interventions include providing education about anxiety and stress, facilitate problem solution skills as well as teach coping skills to help manage anxiety and stress.  We will also use cognitive behavioral therapy to identify and change anxiety provoking thought and behavior patterns as  well as dialectical behavior therapy to teach distress tolerance and mindfulness skills.  Goals for reducing depression will be to help the patient have less sadness as indicated by patient report and PHQ-9 scores.  We will have goals of improved mood or returning to a healthier level of functioning as well as identify causes for depressed mood as a way to help cope with depression.  We will use  a crisis plan if self-harm or suicidal thoughts occur.  Interventions include using cognitive behavioral therapy to explore and replace unhealthy thoughts contributing to depression.  We will encourage the sharing of feelings related to the causes and symptoms of depression as well as teach and encouraged the use of coping skills for managing depressive symptoms.  We will make a crisis plan and assess ongoing level of safety as needed.  Progress 35%  Lorrene CHRISTELLA Hasten, [facility]                             Lorrene CHRISTELLA Hasten, [facility]               Lorrene CHRISTELLA Hasten, [facility]               Lorrene CHRISTELLA Hasten, [facility]               Lorrene CHRISTELLA Hasten, [facility]               Lorrene CHRISTELLA Hasten, [facility]               Lorrene CHRISTELLA Hasten, [facility]               Lorrene CHRISTELLA Hasten, [facility]               Lorrene CHRISTELLA Hasten, [facility]               Lorrene CHRISTELLA Hasten, [facility]               Lorrene CHRISTELLA Hasten, [facility]               Lorrene CHRISTELLA Hasten, [facility]               Lorrene CHRISTELLA Hasten, [facility]               Lorrene CHRISTELLA Hasten, [facility]               Lorrene CHRISTELLA Hasten, [facility]

## 2024-03-29 ENCOUNTER — Ambulatory Visit (INDEPENDENT_AMBULATORY_CARE_PROVIDER_SITE_OTHER): Admitting: Behavioral Health

## 2024-03-29 ENCOUNTER — Encounter: Payer: Self-pay | Admitting: Behavioral Health

## 2024-03-29 DIAGNOSIS — F259 Schizoaffective disorder, unspecified: Secondary | ICD-10-CM | POA: Diagnosis not present

## 2024-03-29 DIAGNOSIS — F4310 Post-traumatic stress disorder, unspecified: Secondary | ICD-10-CM

## 2024-03-29 NOTE — Progress Notes (Signed)
 [facility] Counselor/Therapist Progress Note  Patient ID: Savannah Fleming, MRN: [id number],    Date: 03/29/2024  Time Spent: 2:01 PM until 2:40 PM, 39 minutes spent in person with the patient in the outpatient therapist office.  Treatment Type: Individual Therapy  Reported Symptoms: Anxiety, depression  Mental Status Exam: Appearance:  Casual     Behavior: Appropriate  Motor: Normal  Speech/Language:  Normal Rate  Affect: Flat  Mood: angry, anxious, and sad  Thought process: normal  Thought content:   WNL  Sensory/Perceptual disturbances:   WNL  Orientation: oriented to person, place, time/date, situation, day of week, month of year, and year  Attention: Good  Concentration: Good  Memory: WNL  Fund of knowledge:  Good  Insight:   Good  Judgment:  Good  Impulse Control: Good   Risk Assessment: Danger to Self:  No Self-injurious Behavior: No Danger to Others: No Duty to Warn:no Physical Aggression / Violence:No  Access to Firearms a concern: No  Gang Involvement:No   Subjective: The patient did not feel well today.  She had some sort of virus for the past week and is feeling much better but still talking a lot major cough so we ended the session shorter than usual.  She did say she has been thinking more about the way her mother treats her and it does make sense.  Intellectually she understands that her mother has her own significant issues and is displacing only taking responsibility for her own choices but said it still hurts because she would like to have a mother who validates her and cares about her but she knows that her mother is not capable of doing so. She does contract for safety having no thoughts of hurting herself or anyone else.  Interventions: Cognitive Behavioral Therapy Diagnosis: Schizoaffective disorder Plan: I will meet with the patient every 2 weeks in person as possible   treatment plan:: Goals will be to reduce anxiety and depression by  at least 50% to the use of cognitive behavioral therapy, dialectical behavior therapy with a target goal of December 31st, 2025.  Goals for reducing stress and anxiety will be to improve her ability to manage anxiety symptoms and stress in a healthier way, identify and process causes for anxiety and explore ways to reduce it, resolve the core conflicts that are contributing to anxiety as well as help her manage thoughts and worrisome thinking contributing to feelings of anxiety.  Interventions include providing education about anxiety and stress, facilitate problem solution skills as well as teach coping skills to help manage anxiety and stress.  We will also use cognitive behavioral therapy to identify and change anxiety provoking thought and behavior patterns as well as dialectical behavior therapy to teach distress tolerance and mindfulness skills.  Goals for reducing depression will be to help the patient have less sadness as indicated by patient report and PHQ-9 scores.  We will have goals of improved mood or returning to a healthier level of functioning as well as identify causes for depressed mood as a way to help cope with depression.  We will use a crisis plan if self-harm or suicidal thoughts occur.  Interventions include using cognitive behavioral therapy to explore and replace unhealthy thoughts contributing to depression.  We will encourage the sharing of feelings related to the causes and symptoms of depression as well as teach and encouraged the use of coping skills for managing depressive symptoms.  We will make a crisis plan and assess ongoing level of  safety as needed.  Progress 35%  Lorrene CHRISTELLA Hasten, [facility]                             Lorrene CHRISTELLA Hasten, [facility]               Lorrene CHRISTELLA Hasten, [facility]               Lorrene CHRISTELLA Hasten, [facility]               Lorrene CHRISTELLA Hasten, [facility]               Lorrene CHRISTELLA Hasten,  [facility]               Lorrene CHRISTELLA Hasten, [facility]               Lorrene CHRISTELLA Hasten, [facility]               Lorrene CHRISTELLA Hasten, [facility]               Lorrene CHRISTELLA Hasten, [facility]               Lorrene CHRISTELLA Hasten, [facility]               Lorrene CHRISTELLA Hasten, [facility]               Lorrene CHRISTELLA Hasten, [facility]               Lorrene CHRISTELLA Hasten, [facility]               Lorrene CHRISTELLA Hasten, [facility]               Lorrene CHRISTELLA Hasten, [facility]

## 2024-04-05 ENCOUNTER — Ambulatory Visit (INDEPENDENT_AMBULATORY_CARE_PROVIDER_SITE_OTHER): Admitting: Behavioral Health

## 2024-04-05 ENCOUNTER — Encounter: Payer: Self-pay | Admitting: Behavioral Health

## 2024-04-05 DIAGNOSIS — F259 Schizoaffective disorder, unspecified: Secondary | ICD-10-CM

## 2024-04-05 DIAGNOSIS — F4310 Post-traumatic stress disorder, unspecified: Secondary | ICD-10-CM
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
## 2024-04-20 ENCOUNTER — Ambulatory Visit: Admitting: Behavioral Health

## 2024-04-21 ENCOUNTER — Ambulatory Visit (INDEPENDENT_AMBULATORY_CARE_PROVIDER_SITE_OTHER): Admitting: Behavioral Health

## 2024-04-21 ENCOUNTER — Encounter: Payer: Self-pay | Admitting: Behavioral Health

## 2024-04-21 DIAGNOSIS — F32A Depression, unspecified: Secondary | ICD-10-CM | POA: Diagnosis not present

## 2024-04-21 DIAGNOSIS — F259 Schizoaffective disorder, unspecified: Secondary | ICD-10-CM

## 2024-04-21 DIAGNOSIS — F411 Generalized anxiety disorder: Secondary | ICD-10-CM

## 2024-04-21 DIAGNOSIS — F4310 Post-traumatic stress disorder, unspecified: Secondary | ICD-10-CM
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
## 2024-04-27 ENCOUNTER — Encounter: Payer: Self-pay | Admitting: Behavioral Health

## 2024-04-27 ENCOUNTER — Ambulatory Visit: Admitting: Behavioral Health

## 2024-04-27 DIAGNOSIS — F259 Schizoaffective disorder, unspecified: Secondary | ICD-10-CM

## 2024-04-27 DIAGNOSIS — F4310 Post-traumatic stress disorder, unspecified: Secondary | ICD-10-CM

## 2024-04-27 NOTE — Progress Notes (Signed)
 [facility] Counselor/Therapist Progress Note  Patient ID: Savannah Fleming, MRN: [id number],    Date: 04/27/2024  Time Spent: 2 PM until 2:50 PM, 50 minutes spent in person with the patient in the outpatient therapist office.  Treatment Type: Individual Therapy  Reported Symptoms: Anxiety, depression  Mental Status Exam: Appearance:  Casual     Behavior: Appropriate  Motor: Normal  Speech/Language:  Normal Rate  Affect: Flat  Mood: angry, anxious, and sad  Thought process: normal  Thought content:   WNL  Sensory/Perceptual disturbances:   WNL  Orientation: oriented to person, place, time/date, situation, day of week, month of year, and year  Attention: Good  Concentration: Good  Memory: WNL  Fund of knowledge:  Good  Insight:   Good  Judgment:  Good  Impulse Control: Good   Risk Assessment: Danger to Self:  No Self-injurious Behavior: No Danger to Others: No Duty to Warn:no Physical Aggression / Violence:No  Access to Firearms a concern: No  Gang Involvement:No   Subjective: The patient is tired.  She estimates that over the past few nights she might of slept 2-1/2 for 3 hours each night.  Her daughter is waking her up at various times throughout the night.  1 time it was because the daughter wanted to do laundry when the patient had been asking her to do laundry for months and would not do it.  The patient told her they could not do laundry in the middle of the night because it would affect the neighbors.  Another time it was because something her daughter went to eat or drink and a third time it was the show a video that the daughter did not like and thought the patient would not like either.  The patient has tried to set boundaries with her and thinks maybe her daughter does not understand but I told her that her daughter could understand if she made a sample and felt like she may just be trying to control the environment.  I suggested that she say to her  daughter that tonight from 34 PM until 4 AM that they were all going to rest with no interrupting each other and if the daughter had something after 4 AM they could not wait that she could wake the patient up and then gradually extend that if the daughter will respect those boundaries.  The patient's sponsors and ANA tell her that she is yielding power to the patient but we talked about the importance of how the patient tries to be a parent and be respected for being a parent. She does contract for safety having no thoughts of hurting herself or anyone else.  Interventions: Cognitive Behavioral Therapy Diagnosis: Schizoaffective disorder Plan: I will meet with the patient every 2 weeks in person as possible   treatment plan:: Goals will be to reduce anxiety and depression by at least 50% to the use of cognitive behavioral therapy, dialectical behavior therapy with a target goal of December 31st, 2025.  Goals for reducing stress and anxiety will be to improve her ability to manage anxiety symptoms and stress in a healthier way, identify and process causes for anxiety and explore ways to reduce it, resolve the core conflicts that are contributing to anxiety as well as help her manage thoughts and worrisome thinking contributing to feelings of anxiety.  Interventions include providing education about anxiety and stress, facilitate problem solution skills as well as teach coping skills to help manage anxiety and stress.  We  will also use cognitive behavioral therapy to identify and change anxiety provoking thought and behavior patterns as well as dialectical behavior therapy to teach distress tolerance and mindfulness skills.  Goals for reducing depression will be to help the patient have less sadness as indicated by patient report and PHQ-9 scores.  We will have goals of improved mood or returning to a healthier level of functioning as well as identify causes for depressed mood as a way to help cope with  depression.  We will use a crisis plan if self-harm or suicidal thoughts occur.  Interventions include using cognitive behavioral therapy to explore and replace unhealthy thoughts contributing to depression.  We will encourage the sharing of feelings related to the causes and symptoms of depression as well as teach and encouraged the use of coping skills for managing depressive symptoms.  We will make a crisis plan and assess ongoing level of safety as needed.  Progress 35%  Savannah Fleming, [facility]                             Savannah Fleming, [facility]               Savannah Fleming, [facility]               Savannah Fleming, [facility]               Savannah Fleming, [facility]               Savannah Fleming, [facility]               Savannah Fleming, [facility]               Savannah Fleming, [facility]               Savannah Fleming, [facility]               Savannah Fleming, [facility]               Savannah Fleming, [facility]               Savannah Fleming, [facility]               Savannah Fleming, [facility]               Savannah Fleming, [facility]               Savannah Fleming, [facility]               Savannah Fleming, [facility]               Savannah Fleming, [facility]               Savannah Fleming, [facility]               Savannah Fleming, [facility]

## 2024-05-04 ENCOUNTER — Encounter: Payer: Self-pay | Admitting: Behavioral Health

## 2024-05-04 ENCOUNTER — Ambulatory Visit (INDEPENDENT_AMBULATORY_CARE_PROVIDER_SITE_OTHER): Admitting: Behavioral Health

## 2024-05-04 DIAGNOSIS — F259 Schizoaffective disorder, unspecified: Secondary | ICD-10-CM

## 2024-05-04 DIAGNOSIS — F4310 Post-traumatic stress disorder, unspecified: Secondary | ICD-10-CM

## 2024-05-04 NOTE — Progress Notes (Signed)
 [facility] Counselor/Therapist Progress Note  Patient ID: Savannah Fleming, MRN: [id number],    Date: 05/04/2024  Time Spent: 2 PM until 2:41 PM, 41 minutes spent in person with the patient in the outpatient therapist office.  Treatment Type: Individual Therapy  Reported Symptoms: Anxiety, depression  Mental Status Exam: Appearance:  Casual     Behavior: Appropriate  Motor: Normal  Speech/Language:  Normal Rate  Affect: Flat  Mood: angry, anxious, and sad  Thought process: normal  Thought content:   WNL  Sensory/Perceptual disturbances:   WNL  Orientation: oriented to person, place, time/date, situation, day of week, month of year, and year  Attention: Good  Concentration: Good  Memory: WNL  Fund of knowledge:  Good  Insight:   Good  Judgment:  Good  Impulse Control: Good   Risk Assessment: Danger to Self:  No Self-injurious Behavior: No Danger to Others: No Duty to Warn:no Physical Aggression / Violence:No  Access to Firearms a concern: No  Gang Involvement:No   Subjective: There have been some positives for the patient.  He is working with a nutritionist who has her eating differently especially when she is hungry and not eating big meals.  She is tracking what she eats when she eats.  She is also tracking when that she sleeps and how much she sleeps and is getting on a better sleep schedule.  She did set up blackout.  Per both she and her daughter to respect each other sleep between at least 11 PM and 4 AM and says that she has been going to bed for several nights around 830 or 9 and sleeping till at least 4:00 and sometimes more.  She said she can tell a significant mood improvement just from getting more consistent sleep as well as eating healthier.  She has not gone to the gym 2 days and although she notices slow progress feels better about getting her body and motion.  With more sleep and better eating there has been less conflict with her daughter.  There  are still some boundaries to set with her daughter as well as other family members which have created some triggers over the past few days but she is feeling a sense of optimism.  She does contract for safety having no thoughts of hurting herself or anyone else.  Interventions: Cognitive Behavioral Therapy Diagnosis: Schizoaffective disorder Plan: I will meet with the patient every 2 weeks in person as possible   treatment plan:: Goals will be to reduce anxiety and depression by at least 50% to the use of cognitive behavioral therapy, dialectical behavior therapy with a target goal of December 31st, 2025.  Goals for reducing stress and anxiety will be to improve her ability to manage anxiety symptoms and stress in a healthier way, identify and process causes for anxiety and explore ways to reduce it, resolve the core conflicts that are contributing to anxiety as well as help her manage thoughts and worrisome thinking contributing to feelings of anxiety.  Interventions include providing education about anxiety and stress, facilitate problem solution skills as well as teach coping skills to help manage anxiety and stress.  We will also use cognitive behavioral therapy to identify and change anxiety provoking thought and behavior patterns as well as dialectical behavior therapy to teach distress tolerance and mindfulness skills.  Goals for reducing depression will be to help the patient have less sadness as indicated by patient report and PHQ-9 scores.  We will have goals of improved mood  or returning to a healthier level of functioning as well as identify causes for depressed mood as a way to help cope with depression.  We will use a crisis plan if self-harm or suicidal thoughts occur.  Interventions include using cognitive behavioral therapy to explore and replace unhealthy thoughts contributing to depression.  We will encourage the sharing of feelings related to the causes and symptoms of depression as  well as teach and encouraged the use of coping skills for managing depressive symptoms.  We will make a crisis plan and assess ongoing level of safety as needed.  Progress 35%  Lorrene CHRISTELLA Hasten, [facility]                             Lorrene CHRISTELLA Hasten, [facility]               Lorrene CHRISTELLA Hasten, [facility]               Lorrene CHRISTELLA Hasten, [facility]               Lorrene CHRISTELLA Hasten, [facility]               Lorrene CHRISTELLA Hasten, [facility]               Lorrene CHRISTELLA Hasten, [facility]               Lorrene CHRISTELLA Hasten, [facility]               Lorrene CHRISTELLA Hasten, [facility]               Lorrene CHRISTELLA Hasten, [facility]               Lorrene CHRISTELLA Hasten, [facility]               Lorrene CHRISTELLA Hasten, [facility]               Lorrene CHRISTELLA Hasten, [facility]               Lorrene CHRISTELLA Hasten, [facility]               Lorrene CHRISTELLA Hasten, [facility]               Lorrene CHRISTELLA Hasten, [facility]               Lorrene CHRISTELLA Hasten, [facility]               Lorrene CHRISTELLA Hasten, [facility]               Lorrene CHRISTELLA Hasten, [facility]               Lorrene CHRISTELLA Hasten, [facility]

## 2024-05-11 ENCOUNTER — Encounter: Payer: Self-pay | Admitting: Behavioral Health

## 2024-05-11 ENCOUNTER — Ambulatory Visit: Admitting: Behavioral Health

## 2024-05-11 DIAGNOSIS — F259 Schizoaffective disorder, unspecified: Secondary | ICD-10-CM

## 2024-05-11 DIAGNOSIS — F4310 Post-traumatic stress disorder, unspecified: Secondary | ICD-10-CM

## 2024-05-11 NOTE — Progress Notes (Signed)
 [facility] Counselor/Therapist Progress Note  Patient ID: Savannah Fleming, MRN: [id number],    Date: 05/11/2024  Time Spent: 2:06 PM until 2:58 PM, 52 minutes spent in person with the patient in the outpatient therapist office.  Treatment Type: Individual Therapy  Reported Symptoms: Anxiety, depression  Mental Status Exam: Appearance:  Casual     Behavior: Appropriate  Motor: Normal  Speech/Language:  Normal Rate  Affect: Flat  Mood: angry, anxious, and sad  Thought process: normal  Thought content:   WNL  Sensory/Perceptual disturbances:   WNL  Orientation: oriented to person, place, time/date, situation, day of week, month of year, and year  Attention: Good  Concentration: Good  Memory: WNL  Fund of knowledge:  Good  Insight:   Good  Judgment:  Good  Impulse Control: Good   Risk Assessment: Danger to Self:  No Self-injurious Behavior: No Danger to Others: No Duty to Warn:no Physical Aggression / Violence:No  Access to Firearms a concern: No  Gang Involvement:No   Subjective: There have been some positives for the patient.  She is working with a nutritionist to look at her relationship with food.  She is learning that she eats fast and we looked at some of the possible causes behind that.  She knows that she eats for comfort but she says that she is learning that she does not have to emotionally eat and several times over the past couple of weeks has called her cell.  The nutritional therapist is working with her to help her see the connection between emotions and eating and sleep.  Sleep has been better and more consistent and her daughter is respecting her sleep once the patient requested a healthy boundary.  She does report 1 situational conflict with her daughter where her daughter says that it is the patient's fault for being with a man who is her biological father and that she should never have been born.  The patient says her daughter right now is fixated  on her biological father and she is not sure why.  They cannot find him and he has not a child support in over 10 months.  She recognizes that her daughter is feeling rejected by a man who has not been an active part of her life and the patient has a Museum/gallery curator.  Talked about setting mental and emotional boundaries with the daughter knowing daughter does not know all the circumstances patient's relationship with the daughter's biological father.  She also talked about how her pregnancy with her daughter saved her life because she a significant amount of Tylenol with the intention of overdosing and mention to a coworker she was.  She said the coworker just offhandedly ask her if she was pregnant and she went home pregnancy test and found out that she was.  I encouraged her to continue to be mindful of how she responds to the daughter to work on setting mental emotional boundaries in terms of self-care especially in relationship with the daughter. She does contract for safety having no thoughts of hurting herself or anyone else.  Interventions: Cognitive Behavioral Therapy Diagnosis: Schizoaffective disorder Plan: I will meet with the patient every 2 weeks in person as possible   treatment plan:: Goals will be to reduce anxiety and depression by at least 50% to the use of cognitive behavioral therapy, dialectical behavior therapy with a target goal of December 31st, 2025.  Goals for reducing stress and anxiety will be to improve her ability to manage  anxiety symptoms and stress in a healthier way, identify and process causes for anxiety and explore ways to reduce it, resolve the core conflicts that are contributing to anxiety as well as help her manage thoughts and worrisome thinking contributing to feelings of anxiety.  Interventions include providing education about anxiety and stress, facilitate problem solution skills as well as teach coping skills to help manage anxiety and stress.  We will also  use cognitive behavioral therapy to identify and change anxiety provoking thought and behavior patterns as well as dialectical behavior therapy to teach distress tolerance and mindfulness skills.  Goals for reducing depression will be to help the patient have less sadness as indicated by patient report and PHQ-9 scores.  We will have goals of improved mood or returning to a healthier level of functioning as well as identify causes for depressed mood as a way to help cope with depression.  We will use a crisis plan if self-harm or suicidal thoughts occur.  Interventions include using cognitive behavioral therapy to explore and replace unhealthy thoughts contributing to depression.  We will encourage the sharing of feelings related to the causes and symptoms of depression as well as teach and encouraged the use of coping skills for managing depressive symptoms.  We will make a crisis plan and assess ongoing level of safety as needed.  Progress 40%                           Lorrene CHRISTELLA Hasten, [facility]               Lorrene CHRISTELLA Hasten, [facility]               Lorrene CHRISTELLA Hasten, [facility]               Lorrene CHRISTELLA Hasten, [facility]               Lorrene CHRISTELLA Hasten, [facility]               Lorrene CHRISTELLA Hasten, [facility]               Lorrene CHRISTELLA Hasten, [facility]               Lorrene CHRISTELLA Hasten, [facility]               Lorrene CHRISTELLA Hasten, [facility]               Lorrene CHRISTELLA Hasten, [facility]               Lorrene CHRISTELLA Hasten, [facility]               Lorrene CHRISTELLA Hasten, [facility]               Lorrene CHRISTELLA Hasten, [facility]               Lorrene CHRISTELLA Hasten, [facility]               Lorrene CHRISTELLA Hasten, [facility]               Lorrene CHRISTELLA Hasten, [facility]               Lorrene CHRISTELLA Hasten, [facility]               Lorrene CHRISTELLA Hasten, [facility]               Lorrene CHRISTELLA Hasten, [facility]  Lorrene CHRISTELLA Hasten, [facility]

## 2024-05-18 ENCOUNTER — Ambulatory Visit (INDEPENDENT_AMBULATORY_CARE_PROVIDER_SITE_OTHER): Admitting: Behavioral Health

## 2024-05-18 ENCOUNTER — Encounter: Payer: Self-pay | Admitting: Behavioral Health

## 2024-05-18 DIAGNOSIS — F259 Schizoaffective disorder, unspecified: Secondary | ICD-10-CM

## 2024-05-18 DIAGNOSIS — F4310 Post-traumatic stress disorder, unspecified: Secondary | ICD-10-CM

## 2024-05-18 NOTE — Progress Notes (Signed)
 [facility] Counselor/Therapist Progress Note  Patient ID: Savannah Fleming, MRN: [id number],    Date: 05/18/2024  Time Spent: 12 PM until 12:50 PM, 50 minutes spent in person with the patient in the outpatient therapist office.  Treatment Type: Individual Therapy  Reported Symptoms: Anxiety, depression  Mental Status Exam: Appearance:  Casual     Behavior: Appropriate  Motor: Normal  Speech/Language:  Normal Rate  Affect: Flat  Mood: angry, anxious, and sad  Thought process: normal  Thought content:   WNL  Sensory/Perceptual disturbances:   WNL  Orientation: oriented to person, place, time/date, situation, day of week, month of year, and year  Attention: Good  Concentration: Good  Memory: WNL  Fund of knowledge:  Good  Insight:   Good  Judgment:  Good  Impulse Control: Good   Risk Assessment: Danger to Self:  No Self-injurious Behavior: No Danger to Others: No Duty to Warn:no Physical Aggression / Violence:No  Access to Firearms a concern: No  Gang Involvement:No   Subjective: The patient recognizes that she has been in a manic cycle.  In the past that has been different detrimental behaviors but now its primarily manic cleaning of her home.  He appreciates that she has a clean home but says that often involves not sleeping very much and for several nights she did not sleep many hours at all.  She does have medication that she can take which she did last night and got about 8 hours of sleep.  She has found that even if she is not ready to sleep she will go to her room and lie quietly because her daughter will not sleep as she does not sleep.  She recognizes that she is at about the midpoint of the manic cycle.  The patient is listing to someone else recognize that she spoke to her daughter in a way that could be detrimental so she went to her and said she apologized for the way that she handled the situation and there is a particular phrase that she uses that she  does not want to use anymore because she knows that her daughter does not like it so we talked about ways to let go if that.  Applauded her for apologizing to her daughter.  That when she apologized for 1 thing her daughter drags and other things which she does not need to apologize for so we talked about setting healthy boundaries in that type of conversation.  Her daughter has a chorus Retail buyer and her parents will be there.  She is going with her friend at 1 who they do not trust so encouraged her to set very clear boundaries for she and had 1 sitting in a separate place, having him leave immediately after the concert and for the patient to choose not to engage with her parents.  He does contract for safety having no thoughts of hurting herself or anyone else.  Interventions: Cognitive Behavioral Therapy Diagnosis: Schizoaffective disorder Plan: I will meet with the patient every 2 weeks in person as possible   treatment plan:: Goals will be to reduce anxiety and depression by at least 50% to the use of cognitive behavioral therapy, dialectical behavior therapy with a target goal of December 31st, 2025.  Goals for reducing stress and anxiety will be to improve her ability to manage anxiety symptoms and stress in a healthier way, identify and process causes for anxiety and explore ways to reduce it, resolve the core conflicts that are contributing  to anxiety as well as help her manage thoughts and worrisome thinking contributing to feelings of anxiety.  Interventions include providing education about anxiety and stress, facilitate problem solution skills as well as teach coping skills to help manage anxiety and stress.  We will also use cognitive behavioral therapy to identify and change anxiety provoking thought and behavior patterns as well as dialectical behavior therapy to teach distress tolerance and mindfulness skills.  Goals for reducing depression will be to help the patient have  less sadness as indicated by patient report and PHQ-9 scores.  We will have goals of improved mood or returning to a healthier level of functioning as well as identify causes for depressed mood as a way to help cope with depression.  We will use a crisis plan if self-harm or suicidal thoughts occur.  Interventions include using cognitive behavioral therapy to explore and replace unhealthy thoughts contributing to depression.  We will encourage the sharing of feelings related to the causes and symptoms of depression as well as teach and encouraged the use of coping skills for managing depressive symptoms.  We will make a crisis plan and assess ongoing level of safety as needed.  Progress 40%  Lorrene CHRISTELLA Hasten, [facility]

## 2024-06-08 ENCOUNTER — Encounter (INDEPENDENT_AMBULATORY_CARE_PROVIDER_SITE_OTHER): Payer: Self-pay

## 2024-06-09 ENCOUNTER — Encounter: Payer: Self-pay | Admitting: Behavioral Health

## 2024-06-09 ENCOUNTER — Ambulatory Visit (INDEPENDENT_AMBULATORY_CARE_PROVIDER_SITE_OTHER): Admitting: Behavioral Health

## 2024-06-09 DIAGNOSIS — F4312 Post-traumatic stress disorder, chronic: Secondary | ICD-10-CM | POA: Diagnosis not present

## 2024-06-09 DIAGNOSIS — F4310 Post-traumatic stress disorder, unspecified: Secondary | ICD-10-CM | POA: Diagnosis not present

## 2024-06-09 NOTE — Progress Notes (Addendum)
 [facility] Counselor/Therapist Progress Note  Patient ID: Savannah Fleming, MRN: [id number],    Date: 06/09/2024  Time Spent: 9:01 AM until 9:53 AM, 52 minutes spent in person with the patient in the outpatient therapist office.  Treatment Type: Individual Therapy  Reported Symptoms: Anxiety, depression  Mental Status Exam: Appearance:  Casual     Behavior: Appropriate  Motor: Normal  Speech/Language:  Normal Rate  Affect: Flat  Mood: angry, anxious, and sad  Thought process: normal  Thought content:   WNL  Sensory/Perceptual disturbances:   WNL  Orientation: oriented to person, place, time/date, situation, day of week, month of year, and year  Attention: Good  Concentration: Good  Memory: WNL  Fund of knowledge:  Good  Insight:   Good  Judgment:  Good  Impulse Control: Good   Risk Assessment: Danger to Self:  No Self-injurious Behavior: No Danger to Others: No Duty to Warn:no Physical Aggression / Violence:No  Access to Firearms a concern: No  Gang Involvement:No   Subjective: There have been some positives over the past couple of weeks.  The patient has completed a few more steps toward applying for graduate school and she is excited about that.  She feels like God bless her with the free desk that she can work from which she has not had at home previously.  Continues to work with her nutritionist and is doing a better job of mindful eating.  She says that she still even though she recognizes she needs to stop has continued sometimes but it is no longer to helpings but I helping +2 or 3 more bites.  She feels good about the progress she is making there on her own and with the nutritionist.  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  We talked about a couple of things that she had done which will put her back in terms of pain down her credit card at a few months but it  did not overwhelm her and she back on track and pain down that.  We talked about what she can do to stay on track with that.  We also looked at some things that she can do to replace the urge to spend all online on unnecessary things including getting some simple workouts and at home, going outside, reading some books, reaching out to friends.  He does contract for safety having no thoughts of hurting herself or anyone else.  Interventions: Cognitive Behavioral Therapy Diagnosis: Post Traumatic Stress Disorder Plan: I will meet with the patient every 2 weeks in person as possible   treatment plan:: Goals will be to reduce anxiety and depression by at least 50% to the use of cognitive behavioral therapy, dialectical behavior therapy with a target goal of December 31st, 2025.  Goals for reducing stress and anxiety will be to improve her ability to manage anxiety symptoms and stress in a healthier way, identify and process causes for anxiety and explore ways to reduce it, resolve the core conflicts that are contributing to anxiety as well as help her manage thoughts and worrisome thinking contributing to feelings of anxiety.  Interventions include providing education about anxiety and stress, facilitate problem solution skills as well as teach coping skills to help manage anxiety and stress.  We will also use cognitive behavioral therapy to identify and change anxiety provoking thought and behavior patterns as well as dialectical behavior therapy to  teach distress tolerance and mindfulness skills.  Goals for reducing depression will be to help the patient have less sadness as indicated by patient report and PHQ-9 scores.  We will have goals of improved mood or returning to a healthier level of functioning as well as identify causes for depressed mood as a way to help cope with depression.  We will use a crisis plan if self-harm or suicidal thoughts occur.  Interventions include using cognitive behavioral  therapy to explore and replace unhealthy thoughts contributing to depression.  We will encourage the sharing of feelings related to the causes and symptoms of depression as well as teach and encouraged the use of coping skills for managing depressive symptoms.  We will make a crisis plan and assess ongoing level of safety as needed.  Progress 40%  Lorrene CHRISTELLA Hasten, [facility]                     Lorrene CHRISTELLA Hasten, [facility]

## 2024-06-15 ENCOUNTER — Encounter: Payer: Self-pay | Admitting: Behavioral Health

## 2024-06-15 ENCOUNTER — Ambulatory Visit: Admitting: Behavioral Health

## 2024-06-15 DIAGNOSIS — F4310 Post-traumatic stress disorder, unspecified: Secondary | ICD-10-CM | POA: Diagnosis not present
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
## 2024-07-01 ENCOUNTER — Encounter: Payer: Self-pay | Admitting: Behavioral Health

## 2024-07-01 ENCOUNTER — Ambulatory Visit (INDEPENDENT_AMBULATORY_CARE_PROVIDER_SITE_OTHER): Admitting: Behavioral Health

## 2024-07-01 DIAGNOSIS — F4310 Post-traumatic stress disorder, unspecified: Secondary | ICD-10-CM

## 2024-07-01 NOTE — Progress Notes (Signed)
 [facility] Counselor/Therapist Progress Note  Patient ID: Savannah Fleming, MRN: [id number],    Date: 07/01/2024  Time Spent: 1:00pm until 1:57 pm, 57 minutes spent in person with the patient in the outpatient therapist office.  Treatment Type: Individual Therapy  Reported Symptoms: Anxiety, depression  Mental Status Exam: Appearance:  Casual     Behavior: Appropriate  Motor: Normal  Speech/Language:  Normal Rate  Affect: Flat  Mood: angry, anxious, and sad  Thought process: normal  Thought content:   WNL  Sensory/Perceptual disturbances:   WNL  Orientation: oriented to person, place, time/date, situation, day of week, month of year, and year  Attention: Good  Concentration: Good  Memory: WNL  Fund of knowledge:  Good  Insight:   Good  Judgment:  Good  Impulse Control: Good   Risk Assessment: Danger to Self:  No Self-injurious Behavior: No Danger to Others: No Duty to Warn:no Physical Aggression / Violence:No  Access to Firearms a concern: No  Gang Involvement:No   Subjective: It has been a difficult week for the patient.  Her daughter who will be 39 in December requested that she be able to drop out of school.  She has always had a difficult time staying in school and has not been done twice in the past couple of months.  The patient says that no matter what she has tried or intensive in-home therapist or other therapist have tried to cannot get the patient's daughter to go to school with any consistency.  She feels that when she has tried she has been bullied a good part of her school life.  The patient agreed to letting the student withdraw from school and is starting to take the steps to allow her to do that.  The requirement was that she get her GED which the daughter agreed to but she also said she had to work part-time while getting her GED and then transition to full-time which the daughter agreed to.  She understands that is not necessarily a guarantee but  she also knows that her daughter has not functioned well in school in a long time.  She says that adjustment because she is disappointed for her daughter and at times feels that she could have done something differently.  I encouraged her to try to challenge those thoughts because she has tried to get her daughter to stay in school and help support her educational journey but the daughter has been resistant and/or felt uncomfortable because of all the bullying.  The patient has decided that she is going to apply to grad school and has only a couple of more steps to do before she submits the grad school application in a couple of days.  She is proud of herself for getting to this point and is excited about the possibility of going back to school.  She is still trying to use coping skills on a regular basis and is reaching out to friends for support.  She still sleeping better based on getting to bed at a consistent time and is trying to eat healthier in addition to sleeping well.  She does contract for safety having no thoughts of hurting herself or anyone else.  Interventions: Cognitive Behavioral Therapy Diagnosis: Post Traumatic Stress Disorder Plan: I will meet with the patient every 2 weeks in person as possible   treatment plan:: Goals will be to reduce anxiety and depression by at least 50% to the use of cognitive behavioral therapy, dialectical behavior therapy with  a target goal of December 31st, 2025.  Goals for reducing stress and anxiety will be to improve her ability to manage anxiety symptoms and stress in a healthier way, identify and process causes for anxiety and explore ways to reduce it, resolve the core conflicts that are contributing to anxiety as well as help her manage thoughts and worrisome thinking contributing to feelings of anxiety.  Interventions include providing education about anxiety and stress, facilitate problem solution skills as well as teach coping skills to help manage  anxiety and stress.  We will also use cognitive behavioral therapy to identify and change anxiety provoking thought and behavior patterns as well as dialectical behavior therapy to teach distress tolerance and mindfulness skills.  Goals for reducing depression will be to help the patient have less sadness as indicated by patient report and PHQ-9 scores.  We will have goals of improved mood or returning to a healthier level of functioning as well as identify causes for depressed mood as a way to help cope with depression.  We will use a crisis plan if self-harm or suicidal thoughts occur.  Interventions include using cognitive behavioral therapy to explore and replace unhealthy thoughts contributing to depression.  We will encourage the sharing of feelings related to the causes and symptoms of depression as well as teach and encouraged the use of coping skills for managing depressive symptoms.  We will make a crisis plan and assess ongoing level of safety as needed.  Progress 40%  Lorrene CHRISTELLA Hasten, [facility]                     Lorrene CHRISTELLA Hasten, [facility]               Lorrene CHRISTELLA Hasten, [facility]               Lorrene CHRISTELLA Hasten, [facility]

## 2024-07-13 ENCOUNTER — Ambulatory Visit: Admitting: Behavioral Health

## 2024-07-13 ENCOUNTER — Encounter: Payer: Self-pay | Admitting: Behavioral Health

## 2024-07-13 DIAGNOSIS — F4310 Post-traumatic stress disorder, unspecified: Secondary | ICD-10-CM

## 2024-07-13 NOTE — Progress Notes (Signed)
 [facility] Counselor/Therapist Progress Note  Patient ID: Savannah Fleming, MRN: [id number],    Date: 07/13/2024  Time Spent: 2 PM until 2:47 PM, 47 minutes spent in person with the patient in the outpatient therapist office.  Treatment Type: Individual Therapy  Reported Symptoms: Anxiety, depression  Mental Status Exam: Appearance:  Casual     Behavior: Appropriate  Motor: Normal  Speech/Language:  Normal Rate  Affect: Flat  Mood: angry, anxious, and sad  Thought process: normal  Thought content:   WNL  Sensory/Perceptual disturbances:   WNL  Orientation: oriented to person, place, time/date, situation, day of week, month of year, and year  Attention: Good  Concentration: Good  Memory: WNL  Fund of knowledge:  Good  Insight:   Good  Judgment:  Good  Impulse Control: Good   Risk Assessment: Danger to Self:  No Self-injurious Behavior: No Danger to Others: No Duty to Warn:no Physical Aggression / Violence:No  Access to Firearms a concern: No  Gang Involvement:No   Subjective: The patient's daughter has officially dropped out of school and although she cannot officially unenroll until the end of this week she has not gone back.  The conditions were that she go to the local community college to get her GED and the patient is taking steps to make that happen.  The patient is very proud that she has now submitted everything for acceptance into graduate school at the [employer]  in [place] and is very excited.  He is not sure how long it will take to hear back.  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  She said she made the mistake of telling her mom that she had applied to graduate school and her mom was negative and critical so she is setting clear boundaries now.  We talked about what that looks like in a way that is comfortable for the patient.  Patient says she struggles with doing things for herself  versus doing for others because she feels doing anything other than the basic necessities for herself or selfish.  We looked at her reasoning behind that including the messages that she got from her parents growing up.  We also looked at how she learns to give herself grace and doing some things for herself. She does contract for safety having no thoughts of hurting herself or anyone else.  Interventions: Cognitive Behavioral Therapy Diagnosis: Post Traumatic Stress Disorder Plan: I will meet with the patient every 2 weeks in person as possible   treatment plan:: Goals will be to reduce anxiety and depression by at least 50% to the use of cognitive behavioral therapy, dialectical behavior therapy with a target goal of December 31st, 2025.  Goals for reducing stress and anxiety will be to improve her ability to manage anxiety symptoms and stress in a healthier way, identify and process causes for anxiety and explore ways to reduce it, resolve the core conflicts that are contributing to anxiety as well as help her manage thoughts and worrisome thinking contributing to feelings of anxiety.  Interventions include providing education about anxiety and stress, facilitate problem solution skills as well as teach coping skills to help manage anxiety and stress.  We will also use cognitive behavioral therapy to identify and change anxiety provoking thought and behavior patterns as well as dialectical behavior therapy to teach distress tolerance and mindfulness skills.  Goals for reducing depression will be to help the patient have  less sadness as indicated by patient report and PHQ-9 scores.  We will have goals of improved mood or returning to a healthier level of functioning as well as identify causes for depressed mood as a way to help cope with depression.  We will use a crisis plan if self-harm or suicidal thoughts occur.  Interventions include using cognitive behavioral therapy to explore and replace unhealthy  thoughts contributing to depression.  We will encourage the sharing of feelings related to the causes and symptoms of depression as well as teach and encouraged the use of coping skills for managing depressive symptoms.  We will make a crisis plan and assess ongoing level of safety as needed.  Progress 40%  Lorrene CHRISTELLA Hasten, [facility]                     Lorrene CHRISTELLA Hasten, [facility]               Lorrene CHRISTELLA Hasten, [facility]               Lorrene CHRISTELLA Hasten, [facility]               Lorrene CHRISTELLA Hasten, [facility]

## 2024-07-20 ENCOUNTER — Ambulatory Visit: Admitting: Behavioral Health

## 2024-07-20 ENCOUNTER — Encounter: Payer: Self-pay | Admitting: Behavioral Health

## 2024-07-20 DIAGNOSIS — F4310 Post-traumatic stress disorder, unspecified: Secondary | ICD-10-CM

## 2024-07-20 NOTE — Progress Notes (Signed)
 [name] Counselor/Therapist Progress Note  Patient ID: Savannah Fleming, MRN: [id number],    Date: 07/20/2024  Time Spent: 11 AM until 11:51 AM, 51 minutes spent via a care agility telehealth video visit.  The patient consented to the video visit and is aware of its limitations.  The patient was at home and this therapist was in his home outpatient therapy office.  Treatment Type: Individual Therapy  Reported Symptoms: Anxiety, depression  Mental Status Exam: Appearance:  Casual     Behavior: Appropriate  Motor: Normal  Speech/Language:  Normal Rate  Affect: Flat  Mood: angry, anxious, and sad  Thought process: normal  Thought content:   WNL  Sensory/Perceptual disturbances:   WNL  Orientation: oriented to person, place, time/date, situation, day of week, month of year, and year  Attention: Good  Concentration: Good  Memory: WNL  Fund of knowledge:  Good  Insight:   Good  Judgment:  Good  Impulse Control: Good   Risk Assessment: Danger to Self:  No Self-injurious Behavior: No Danger to Others: No Duty to Warn:no Physical Aggression / Violence:No  Access to Firearms a concern: No  Gang Involvement:No   Subjective: The patient said that she woke up and somewhat of a depressed mood and was not exactly sure why so we did process some of what is going on.  Her 32 year old daughter is now officially enrolled from school and there is a sense of sadness about that for the patient.  She recognizes that her daughter will have to take some initiative from here and that does make her sad because the patient values education.  She also recognize that she allows herself to inherit other people's feelings as opposed to mindfully looking at what she is feeling and allowing herself to follow through with that.  We talked about recognizing her own emotions and giving herself permission to experience those.  She is doing some good things including continuing to eat mindfully.   Her A1c level was up slightly and she knows that she was drinking too many soft drinks so she has cut back on that significantly.  She is checking her blood sugar consistently to make sure of what she can eat and is talking with her dietitian about that consistently.  She does contract for safety having no thoughts of hurting herself or anyone else.  Interventions: Cognitive Behavioral Therapy Diagnosis: Post Traumatic Stress Disorder Plan: I will meet with the patient every 2 weeks in person as possible   treatment plan:: Goals will be to reduce anxiety and depression by at least 50% to the use of cognitive behavioral therapy, dialectical behavior therapy with a target goal of December 31st, 2025.  Goals for reducing stress and anxiety will be to improve her ability to manage anxiety symptoms and stress in a healthier way, identify and process causes for anxiety and explore ways to reduce it, resolve the core conflicts that are contributing to anxiety as well as help her manage thoughts and worrisome thinking contributing to feelings of anxiety.  Interventions include providing education about anxiety and stress, facilitate problem solution skills as well as teach coping skills to help manage anxiety and stress.  We will also use cognitive behavioral therapy to identify and change anxiety provoking thought and behavior patterns as well as dialectical behavior therapy to teach distress tolerance and mindfulness skills.  Goals for reducing depression will be to help the patient have less sadness as indicated by patient report and PHQ-9 scores.  We  will have goals of improved mood or returning to a healthier level of functioning as well as identify causes for depressed mood as a way to help cope with depression.  We will use a crisis plan if self-harm or suicidal thoughts occur.  Interventions include using cognitive behavioral therapy to explore and replace unhealthy thoughts contributing to depression.  We  will encourage the sharing of feelings related to the causes and symptoms of depression as well as teach and encouraged the use of coping skills for managing depressive symptoms.  We will make a crisis plan and assess ongoing level of safety as needed.  Progress 40%  Savannah Fleming, [facility]                     Savannah Fleming, [facility]               Savannah Fleming, [facility]               Savannah Fleming, [facility]               Savannah Fleming, [facility]               Savannah Fleming, [facility]

## 2024-07-27 ENCOUNTER — Ambulatory Visit: Admitting: Behavioral Health

## 2024-08-03 ENCOUNTER — Ambulatory Visit: Admitting: Behavioral Health

## 2024-08-03 ENCOUNTER — Encounter: Payer: Self-pay | Admitting: Behavioral Health

## 2024-08-03 DIAGNOSIS — F4310 Post-traumatic stress disorder, unspecified: Secondary | ICD-10-CM | POA: Diagnosis not present

## 2024-08-03 NOTE — Progress Notes (Signed)
 "  [facility] Counselor/Therapist Progress Note  Patient ID: Savannah Fleming, MRN: [id number],    Date: 08/03/2024  Time Spent: 2 PM until 2:45 PM, 45 minutes face-to-face with the patient in the outpatient therapist office. Treatment Type: Individual Therapy  Reported Symptoms: Anxiety, depression  Mental Status Exam: Appearance:  Casual     Behavior: Appropriate  Motor: Normal  Speech/Language:  Normal Rate  Affect: Flat  Mood: angry, anxious, and sad  Thought process: normal  Thought content:   WNL  Sensory/Perceptual disturbances:   WNL  Orientation: oriented to person, place, time/date, situation, day of week, month of year, and year  Attention: Good  Concentration: Good  Memory: WNL  Fund of knowledge:  Good  Insight:   Good  Judgment:  Good  Impulse Control: Good   Risk Assessment: Danger to Self:  No Self-injurious Behavior: No Danger to Others: No Duty to Warn:no Physical Aggression / Violence:No  Access to Firearms a concern: No  Gang Involvement:No   Subjective: The patient is making some very positive moves.  She is down 11 pounds in her dietitian and is happy with that.  She is able to recognize that there is a cutoff point even with sweets.  She went to a buffet and only ate until she was comfortably full as opposed to being stuffed and miserable.  She knows that she needs to work in exercise and has membership to the last we talked about some of the impediments keeping her from getting there including it being cold outside and also anxiety about working out and other people being around.  She wants to go to line dancing which starts at 1145 so we talked about the fact that she could sleep until 10:00 and still make it and that would be an activity where she would not necessarily be judged.  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  She knows that having  a little extra money and something to give her sense of purpose will be good for her.  Her friend had 1 is taking she and her daughter to the beach for a long weekend for her birthday the weekend after New Year's. She does contract for safety having no thoughts of hurting herself or anyone else.  Interventions: Cognitive Behavioral Therapy Diagnosis: Post Traumatic Stress Disorder Plan: I will meet with the patient every 2 weeks in person as possible   treatment plan:: Goals will be to reduce anxiety and depression by at least 50% to the use of cognitive behavioral therapy, dialectical behavior therapy with a target goal of December 31st, 2025.  Goals for reducing stress and anxiety will be to improve her ability to manage anxiety symptoms and stress in a healthier way, identify and process causes for anxiety and explore ways to reduce it, resolve the core conflicts that are contributing to anxiety as well as help her manage thoughts and worrisome thinking contributing to feelings of anxiety.  Interventions include providing education about anxiety and stress, facilitate problem solution skills as well as teach coping skills to help manage anxiety and stress.  We will also use cognitive behavioral therapy to identify and change anxiety provoking thought and behavior patterns as well as dialectical behavior therapy to teach distress tolerance and mindfulness skills.  Goals for reducing depression will be to help the patient have less sadness as indicated by patient report and PHQ-9  scores.  We will have goals of improved mood or returning to a healthier level of functioning as well as identify causes for depressed mood as a way to help cope with depression.  We will use a crisis plan if self-harm or suicidal thoughts occur.  Interventions include using cognitive behavioral therapy to explore and replace unhealthy thoughts contributing to depression.  We will encourage the sharing of feelings related to the  causes and symptoms of depression as well as teach and encouraged the use of coping skills for managing depressive symptoms.  We will make a crisis plan and assess ongoing level of safety as needed.  Progress 40%  Lorrene CHRISTELLA Hasten, [facility]                     Lorrene CHRISTELLA Hasten, [facility]               Lorrene CHRISTELLA Hasten, [facility]               Lorrene CHRISTELLA Hasten, [facility]               Lorrene CHRISTELLA Hasten, [facility]               Lorrene CHRISTELLA Hasten, [facility]               Lorrene CHRISTELLA Hasten, [facility] "

## 2024-08-17 ENCOUNTER — Ambulatory Visit: Admitting: Behavioral Health

## 2024-08-17 ENCOUNTER — Encounter: Payer: Self-pay | Admitting: Behavioral Health

## 2024-08-17 DIAGNOSIS — F4310 Post-traumatic stress disorder, unspecified: Secondary | ICD-10-CM | POA: Diagnosis not present
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
## 2024-08-24 ENCOUNTER — Encounter: Payer: Self-pay | Admitting: Behavioral Health

## 2024-08-24 ENCOUNTER — Ambulatory Visit: Admitting: Behavioral Health

## 2024-08-24 DIAGNOSIS — F4310 Post-traumatic stress disorder, unspecified: Secondary | ICD-10-CM | POA: Diagnosis not present

## 2024-08-24 DIAGNOSIS — F319 Bipolar disorder, unspecified: Secondary | ICD-10-CM
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
## 2024-08-31 ENCOUNTER — Ambulatory Visit: Admitting: Behavioral Health

## 2024-08-31 ENCOUNTER — Encounter: Payer: Self-pay | Admitting: Behavioral Health

## 2024-08-31 DIAGNOSIS — F4310 Post-traumatic stress disorder, unspecified: Secondary | ICD-10-CM

## 2024-08-31 DIAGNOSIS — F319 Bipolar disorder, unspecified: Secondary | ICD-10-CM
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
## 2024-09-07 ENCOUNTER — Ambulatory Visit: Admitting: Behavioral Health

## 2024-09-07 ENCOUNTER — Encounter: Payer: Self-pay | Admitting: Behavioral Health

## 2024-09-07 DIAGNOSIS — F4310 Post-traumatic stress disorder, unspecified: Secondary | ICD-10-CM | POA: Diagnosis not present

## 2024-09-07 DIAGNOSIS — F319 Bipolar disorder, unspecified: Secondary | ICD-10-CM
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
## 2024-09-21 ENCOUNTER — Ambulatory Visit: Admitting: Behavioral Health

## 2024-10-05 ENCOUNTER — Ambulatory Visit: Admitting: Behavioral Health

## 2024-10-19 ENCOUNTER — Ambulatory Visit: Admitting: Behavioral Health

## 2024-11-02 ENCOUNTER — Ambulatory Visit: Admitting: Behavioral Health
